# Patient Record
Sex: Female | Born: 1965 | Race: White | Hispanic: No | Marital: Married | Smoking: Never smoker
Health system: Southern US, Community
[De-identification: ages and names within clinical notes are randomized; demographics above are authoritative.]

## PROBLEM LIST (undated history)

## (undated) DIAGNOSIS — M069 Rheumatoid arthritis, unspecified: Secondary | ICD-10-CM

## (undated) DIAGNOSIS — IMO0002 Reserved for concepts with insufficient information to code with codable children: Secondary | ICD-10-CM

## (undated) DIAGNOSIS — M359 Systemic involvement of connective tissue, unspecified: Secondary | ICD-10-CM

## (undated) DIAGNOSIS — G43909 Migraine, unspecified, not intractable, without status migrainosus: Secondary | ICD-10-CM

## (undated) HISTORY — PX: BLADDER SUSPENSION: SHX72

## (undated) HISTORY — PX: ANTERIOR AND POSTERIOR VAGINAL REPAIR: SUR5

## (undated) HISTORY — PX: APPENDECTOMY: SHX54

## (undated) HISTORY — PX: TONSILLECTOMY: SUR1361

## (undated) HISTORY — PX: ABDOMINAL HYSTERECTOMY: SHX81

## (undated) HISTORY — PX: OTHER SURGICAL HISTORY: SHX169

---

## 2000-10-14 ENCOUNTER — Emergency Department (HOSPITAL_COMMUNITY): Admission: EM | Admit: 2000-10-14 | Discharge: 2000-10-14 | Payer: Self-pay | Admitting: Emergency Medicine

## 2005-11-24 ENCOUNTER — Emergency Department (HOSPITAL_COMMUNITY): Admission: AD | Admit: 2005-11-24 | Discharge: 2005-11-24 | Payer: Self-pay | Admitting: Emergency Medicine

## 2013-06-11 ENCOUNTER — Encounter (HOSPITAL_BASED_OUTPATIENT_CLINIC_OR_DEPARTMENT_OTHER): Payer: Self-pay | Admitting: Emergency Medicine

## 2013-06-11 ENCOUNTER — Emergency Department (HOSPITAL_BASED_OUTPATIENT_CLINIC_OR_DEPARTMENT_OTHER)
Admission: EM | Admit: 2013-06-11 | Discharge: 2013-06-11 | Disposition: A | Discharge status: Home / Self Care | Payer: Commercial Managed Care - PPO | Attending: Emergency Medicine | Admitting: Emergency Medicine

## 2013-06-11 DIAGNOSIS — R112 Nausea with vomiting, unspecified: Secondary | ICD-10-CM

## 2013-06-11 DIAGNOSIS — M359 Systemic involvement of connective tissue, unspecified: Secondary | ICD-10-CM | POA: Insufficient documentation

## 2013-06-11 DIAGNOSIS — R109 Unspecified abdominal pain: Secondary | ICD-10-CM | POA: Insufficient documentation

## 2013-06-11 DIAGNOSIS — G43909 Migraine, unspecified, not intractable, without status migrainosus: Secondary | ICD-10-CM | POA: Insufficient documentation

## 2013-06-11 DIAGNOSIS — IMO0002 Reserved for concepts with insufficient information to code with codable children: Secondary | ICD-10-CM | POA: Insufficient documentation

## 2013-06-11 DIAGNOSIS — Z79899 Other long term (current) drug therapy: Secondary | ICD-10-CM | POA: Insufficient documentation

## 2013-06-11 DIAGNOSIS — Z9889 Other specified postprocedural states: Secondary | ICD-10-CM | POA: Insufficient documentation

## 2013-06-11 HISTORY — DX: Reserved for concepts with insufficient information to code with codable children: IMO0002

## 2013-06-11 HISTORY — DX: Systemic involvement of connective tissue, unspecified: M35.9

## 2013-06-11 HISTORY — DX: Migraine, unspecified, not intractable, without status migrainosus: G43.909

## 2013-06-11 LAB — URINALYSIS, ROUTINE W REFLEX MICROSCOPIC
BILIRUBIN URINE: NEGATIVE
GLUCOSE, UA: NEGATIVE mg/dL
Hgb urine dipstick: NEGATIVE
KETONES UR: 15 mg/dL — AB
Leukocytes, UA: NEGATIVE
Nitrite: NEGATIVE
PROTEIN: NEGATIVE mg/dL
Specific Gravity, Urine: 1.024 (ref 1.005–1.030)
UROBILINOGEN UA: 0.2 mg/dL (ref 0.0–1.0)
pH: 7 (ref 5.0–8.0)

## 2013-06-11 LAB — COMPREHENSIVE METABOLIC PANEL
ALK PHOS: 91 U/L (ref 39–117)
ALT: 10 U/L (ref 0–35)
AST: 13 U/L (ref 0–37)
Albumin: 4.2 g/dL (ref 3.5–5.2)
BILIRUBIN TOTAL: 0.2 mg/dL — AB (ref 0.3–1.2)
BUN: 14 mg/dL (ref 6–23)
CALCIUM: 9.8 mg/dL (ref 8.4–10.5)
CHLORIDE: 104 meq/L (ref 96–112)
CO2: 25 meq/L (ref 19–32)
Creatinine, Ser: 1 mg/dL (ref 0.50–1.10)
GFR, EST AFRICAN AMERICAN: 75 mL/min — AB (ref >90)
GFR, EST NON AFRICAN AMERICAN: 65 mL/min — AB (ref >90)
GLUCOSE: 122 mg/dL — AB (ref 70–99)
Potassium: 4 mEq/L (ref 3.7–5.3)
SODIUM: 142 meq/L (ref 137–147)
Total Protein: 7 g/dL (ref 6.0–8.3)

## 2013-06-11 LAB — CBC WITH DIFFERENTIAL/PLATELET
Basophils Absolute: 0 10*3/uL (ref 0.0–0.1)
Basophils Relative: 0 % (ref 0–1)
EOS PCT: 0 % (ref 0–5)
Eosinophils Absolute: 0 10*3/uL (ref 0.0–0.7)
HCT: 42 % (ref 36.0–46.0)
Hemoglobin: 13.7 g/dL (ref 12.0–15.0)
LYMPHS ABS: 0.7 10*3/uL (ref 0.7–4.0)
LYMPHS PCT: 8 % — AB (ref 12–46)
MCH: 29.3 pg (ref 26.0–34.0)
MCHC: 32.6 g/dL (ref 30.0–36.0)
MCV: 89.9 fL (ref 78.0–100.0)
Monocytes Absolute: 0.4 10*3/uL (ref 0.1–1.0)
Monocytes Relative: 5 % (ref 3–12)
NEUTROS ABS: 7.8 10*3/uL — AB (ref 1.7–7.7)
Neutrophils Relative %: 88 % — ABNORMAL HIGH (ref 43–77)
PLATELETS: 198 10*3/uL (ref 150–400)
RBC: 4.67 MIL/uL (ref 3.87–5.11)
RDW: 12.3 % (ref 11.5–15.5)
WBC: 8.8 10*3/uL (ref 4.0–10.5)

## 2013-06-11 LAB — LIPASE, BLOOD: Lipase: 14 U/L (ref 11–59)

## 2013-06-11 MED ORDER — PROMETHAZINE HCL 25 MG/ML IJ SOLN
25.0000 mg | Freq: Once | INTRAMUSCULAR | Status: AC
  Administered 2013-06-11: 25 mg via INTRAVENOUS
  Filled 2013-06-11: qty 1

## 2013-06-11 MED ORDER — PROMETHAZINE HCL 25 MG PO TABS: 25.0000 mg | ORAL_TABLET | Freq: Four times a day (QID) | ORAL | Status: DC | PRN

## 2013-06-11 MED ORDER — ONDANSETRON HCL 4 MG/2ML IJ SOLN
4.0000 mg | Freq: Once | INTRAMUSCULAR | Status: AC
  Administered 2013-06-11: 4 mg via INTRAVENOUS
  Filled 2013-06-11: qty 2

## 2013-06-11 MED ORDER — SODIUM CHLORIDE 0.9 % IV BOLUS (SEPSIS)
1000.0000 mL | Freq: Once | INTRAVENOUS | Status: AC
  Administered 2013-06-11: 1000 mL via INTRAVENOUS

## 2013-06-11 MED ORDER — ONDANSETRON 8 MG PO TBDP: 8.0000 mg | ORAL_TABLET | Freq: Three times a day (TID) | ORAL | Status: DC | PRN

## 2013-06-11 NOTE — ED Notes (Signed)
Abdominal pain, vomiting and headache that started last night.  Vomited x 8.

## 2013-06-11 NOTE — ED Notes (Signed)
The patient stated that she is unable to urinate at this time. She know that we need a sample she stated that she will get one as soon as she is able.

## 2013-06-11 NOTE — ED Provider Notes (Addendum)
CSN: [id number]     Arrival date & time 06/11/13  1243 History   First MD Initiated Contact with Patient 06/11/13 1414     Chief Complaint  Patient presents with  . Emesis  . Abdominal Pain     (Consider location/radiation/quality/duration/timing/severity/associated sxs/prior Treatment) HPI 48 year old female who presents today complaining of nausea and vomiting. She states she began vomiting last night and that it was initially food but then became just a yellowish substance. She has had approximately 8 episodes of vomiting. She has had a few loose stools with this. She has not had any fever or chills. She states she had one similar episode in the past which did require hospitalization due to intractable vomiting 4. She took Zofran at home without relief. Past Medical History  Diagnosis Date  . DDD (degenerative disc disease)   . Migraine   . Connective tissue disease    Past Surgical History  Procedure Laterality Date  . Appendectomy    . Tonsillectomy    . Uterine sling    . Abdominal hysterectomy    . Bladder suspension    . Vaginalplasty    . Endocele repair    . Anterior and posterior vaginal repair     No family history on file. History  Substance Use Topics  . Smoking status: Never Smoker   . Smokeless tobacco: Not on file  . Alcohol Use: No   OB History   Grav Para Term Preterm Abortions TAB SAB Ect Mult Living                 Review of Systems  Constitutional: Negative.  Negative for fever.  HENT: Negative.   Respiratory: Negative.   Cardiovascular: Negative.   Gastrointestinal: Positive for nausea and vomiting. Negative for abdominal pain, constipation, blood in stool and abdominal distention.  Endocrine: Negative.   Genitourinary: Negative.   Neurological: Negative.   Hematological: Negative.   Psychiatric/Behavioral: Negative.   All other systems reviewed and are negative.      Allergies  Biaxin; Decadron; Depacon; Depakote; Dhea; Reglan;  Toradol; and Ultram  Home Medications   Current Outpatient Rx  Name  Route  Sig  Dispense  Refill  . BuPROPion HCl (WELLBUTRIN PO)   Oral   Take by mouth.         . ClonazePAM (KLONOPIN PO)   Oral   Take by mouth.         . Esomeprazole Magnesium (NEXIUM PO)   Oral   Take by mouth.         . fentaNYL (DURAGESIC - DOSED MCG/HR) 50 MCG/HR   Transdermal   Place 50 mcg onto the skin every 3 (three) days.         Marland Kitchen. HYDROcodone-acetaminophen (NORCO/VICODIN) 5-325 MG per tablet   Oral   Take 1 tablet by mouth every 6 (six) hours as needed for moderate pain.         . hydroxychloroquine (PLAQUENIL) 200 MG tablet   Oral   Take by mouth daily.         . Ondansetron HCl (ZOFRAN PO)   Oral   Take by mouth.         . prednisoLONE 5 MG TABS tablet   Oral   Take by mouth.         . Prochlorperazine Maleate (COMPAZINE PO)   Oral   Take by mouth.         Marland Kitchen. TIZANIDINE HCL PO   Oral  Take by mouth.         . topiramate (TOPAMAX) 100 MG tablet   Oral   Take 100 mg by mouth 2 (two) times daily.         Marland Kitchen ZOLMitriptan (ZOMIG PO)   Oral   Take by mouth.         . Zolpidem Tartrate (AMBIEN PO)   Oral   Take by mouth.          BP 102/59  Pulse 90  Temp(Src) 98.5 F (36.9 C) (Oral)  Resp 24 Physical Exam  Nursing note and vitals reviewed. Constitutional: She is oriented to person, place, and time. She appears well-developed and well-nourished.  HENT:  Head: Normocephalic and atraumatic.  Right Ear: External ear normal.  Left Ear: External ear normal.  Nose: Nose normal.  Mouth/Throat: Oropharynx is clear and moist.  Eyes: Conjunctivae and EOM are normal. Pupils are equal, round, and reactive to light.  Neck: Normal range of motion. Neck supple.  Cardiovascular: Normal rate, regular rhythm, normal heart sounds and intact distal pulses.   Pulmonary/Chest: Effort normal and breath sounds normal.  Abdominal: Soft. Bowel sounds are normal.   Musculoskeletal: Normal range of motion.  Neurological: She is alert and oriented to person, place, and time. She has normal reflexes.  Skin: Skin is warm and dry.  Psychiatric: She has a normal mood and affect. Her behavior is normal. Thought content normal.    ED Course  Procedures (including critical care time) Labs Review Labs Reviewed  CBC WITH DIFFERENTIAL - Abnormal; Notable for the following:    Neutrophils Relative % 88 (*)    Neutro Abs 7.8 (*)    Lymphocytes Relative 8 (*)    All other components within normal limits  COMPREHENSIVE METABOLIC PANEL - Abnormal; Notable for the following:    Glucose, Bld 122 (*)    Total Bilirubin 0.2 (*)    GFR calc non Af Amer 65 (*)    GFR calc Af Amer 75 (*)    All other components within normal limits  LIPASE, BLOOD  URINALYSIS, ROUTINE W REFLEX MICROSCOPIC   Imaging Review No results found.   EKG Interpretation None      MDM   Final diagnoses:  None    Patient received IV hydration here and is able to tolerate clear liquids although she continues to be nauseated. Labs look generally normal with the exception of glucose at 122 and 88% neutrophils. Her plan is to give patient prescriptions for anti-emetics. She is counseled regarding oral rehydration. She is advised to return if she is worse or not able to tolerate fluids over 24-48 hours.   Hilario Quarry, MD 06/11/13 1549  Hilario Quarry, MD 07/07/13 [phone]

## 2013-06-11 NOTE — Discharge Instructions (Signed)

## 2013-08-16 ENCOUNTER — Emergency Department (HOSPITAL_BASED_OUTPATIENT_CLINIC_OR_DEPARTMENT_OTHER): Payer: Commercial Managed Care - PPO

## 2013-08-16 ENCOUNTER — Emergency Department (HOSPITAL_BASED_OUTPATIENT_CLINIC_OR_DEPARTMENT_OTHER)
Admission: EM | Admit: 2013-08-16 | Discharge: 2013-08-17 | Disposition: A | Discharge status: Home / Self Care | Payer: Commercial Managed Care - PPO | Attending: Emergency Medicine | Admitting: Emergency Medicine

## 2013-08-16 ENCOUNTER — Encounter (HOSPITAL_BASED_OUTPATIENT_CLINIC_OR_DEPARTMENT_OTHER): Payer: Self-pay | Admitting: Emergency Medicine

## 2013-08-16 DIAGNOSIS — N390 Urinary tract infection, site not specified: Secondary | ICD-10-CM | POA: Insufficient documentation

## 2013-08-16 DIAGNOSIS — R109 Unspecified abdominal pain: Secondary | ICD-10-CM | POA: Diagnosis not present

## 2013-08-16 DIAGNOSIS — R110 Nausea: Secondary | ICD-10-CM | POA: Insufficient documentation

## 2013-08-16 DIAGNOSIS — G43909 Migraine, unspecified, not intractable, without status migrainosus: Secondary | ICD-10-CM | POA: Diagnosis not present

## 2013-08-16 DIAGNOSIS — R300 Dysuria: Secondary | ICD-10-CM | POA: Diagnosis present

## 2013-08-16 DIAGNOSIS — Z79899 Other long term (current) drug therapy: Secondary | ICD-10-CM | POA: Diagnosis not present

## 2013-08-16 DIAGNOSIS — Z87442 Personal history of urinary calculi: Secondary | ICD-10-CM | POA: Diagnosis not present

## 2013-08-16 DIAGNOSIS — IMO0002 Reserved for concepts with insufficient information to code with codable children: Secondary | ICD-10-CM | POA: Insufficient documentation

## 2013-08-16 LAB — CBC WITH DIFFERENTIAL/PLATELET
Basophils Absolute: 0 10*3/uL (ref 0.0–0.1)
Basophils Relative: 0 % (ref 0–1)
EOS ABS: 0.1 10*3/uL (ref 0.0–0.7)
EOS PCT: 2 % (ref 0–5)
HCT: 39.6 % (ref 36.0–46.0)
Hemoglobin: 12.8 g/dL (ref 12.0–15.0)
LYMPHS PCT: 21 % (ref 12–46)
Lymphs Abs: 1.3 10*3/uL (ref 0.7–4.0)
MCH: 29.4 pg (ref 26.0–34.0)
MCHC: 32.3 g/dL (ref 30.0–36.0)
MCV: 91 fL (ref 78.0–100.0)
Monocytes Absolute: 0.4 10*3/uL (ref 0.1–1.0)
Monocytes Relative: 7 % (ref 3–12)
Neutro Abs: 4.1 10*3/uL (ref 1.7–7.7)
Neutrophils Relative %: 70 % (ref 43–77)
PLATELETS: 149 10*3/uL — AB (ref 150–400)
RBC: 4.35 MIL/uL (ref 3.87–5.11)
RDW: 12.8 % (ref 11.5–15.5)
WBC: 5.9 10*3/uL (ref 4.0–10.5)

## 2013-08-16 LAB — URINALYSIS, ROUTINE W REFLEX MICROSCOPIC
Glucose, UA: NEGATIVE mg/dL
KETONES UR: 15 mg/dL — AB
Nitrite: POSITIVE — AB
PROTEIN: 100 mg/dL — AB
Specific Gravity, Urine: 1.029 (ref 1.005–1.030)
Urobilinogen, UA: >8 mg/dL — ABNORMAL HIGH (ref 0.0–1.0)
pH: 5 (ref 5.0–8.0)

## 2013-08-16 LAB — URINE MICROSCOPIC-ADD ON

## 2013-08-16 LAB — BASIC METABOLIC PANEL
BUN: 14 mg/dL (ref 6–23)
CALCIUM: 9.4 mg/dL (ref 8.4–10.5)
CO2: 25 mEq/L (ref 19–32)
Chloride: 108 mEq/L (ref 96–112)
Creatinine, Ser: 1.1 mg/dL (ref 0.50–1.10)
GFR calc Af Amer: 67 mL/min — ABNORMAL LOW (ref >90)
GFR, EST NON AFRICAN AMERICAN: 58 mL/min — AB (ref >90)
Glucose, Bld: 74 mg/dL (ref 70–99)
Potassium: 3.6 mEq/L — ABNORMAL LOW (ref 3.7–5.3)
SODIUM: 143 meq/L (ref 137–147)

## 2013-08-16 MED ORDER — SULFAMETHOXAZOLE-TRIMETHOPRIM 800-160 MG PO TABS: 1.0000 | ORAL_TABLET | Freq: Two times a day (BID) | ORAL | Status: AC

## 2013-08-16 MED ORDER — PROMETHAZINE HCL 25 MG PO TABS: 25.0000 mg | ORAL_TABLET | Freq: Four times a day (QID) | ORAL | Status: DC | PRN

## 2013-08-16 MED ORDER — OXYCODONE-ACETAMINOPHEN 5-325 MG PO TABS: 1.0000 | ORAL_TABLET | ORAL | Status: AC | PRN

## 2013-08-16 MED ORDER — PHENAZOPYRIDINE HCL 95 MG PO TABS: 95.0000 mg | ORAL_TABLET | Freq: Three times a day (TID) | ORAL | Status: AC | PRN

## 2013-08-16 MED ORDER — MORPHINE SULFATE 4 MG/ML IJ SOLN
4.0000 mg | Freq: Once | INTRAMUSCULAR | Status: AC
  Administered 2013-08-16: 4 mg via INTRAVENOUS
  Filled 2013-08-16: qty 1

## 2013-08-16 MED ORDER — HYDROMORPHONE HCL PF 1 MG/ML IJ SOLN
1.0000 mg | Freq: Once | INTRAMUSCULAR | Status: AC
Start: 2013-08-16 — End: 2013-08-16
  Administered 2013-08-16: 1 mg via INTRAVENOUS
  Filled 2013-08-16: qty 1

## 2013-08-16 MED ORDER — IOHEXOL 300 MG/ML  SOLN
100.0000 mL | Freq: Once | INTRAMUSCULAR | Status: AC | PRN
  Administered 2013-08-16: 100 mL via INTRAVENOUS

## 2013-08-16 MED ORDER — PROMETHAZINE HCL 25 MG/ML IJ SOLN
25.0000 mg | Freq: Once | INTRAMUSCULAR | Status: AC
  Administered 2013-08-16: 25 mg via INTRAVENOUS
  Filled 2013-08-16: qty 1

## 2013-08-16 MED ORDER — CEFTRIAXONE SODIUM 1 G IJ SOLR
INTRAMUSCULAR | Status: AC
  Administered 2013-08-16: 19:00:00
  Filled 2013-08-16: qty 10

## 2013-08-16 MED ORDER — ONDANSETRON HCL 4 MG/2ML IJ SOLN
4.0000 mg | Freq: Once | INTRAMUSCULAR | Status: AC
  Administered 2013-08-16: 4 mg via INTRAVENOUS
  Filled 2013-08-16: qty 2

## 2013-08-16 MED ORDER — DEXTROSE 5 % IV SOLN
1.0000 g | Freq: Once | INTRAVENOUS | Status: AC
  Administered 2013-08-16: 1 g via INTRAVENOUS

## 2013-08-16 MED ORDER — SODIUM CHLORIDE 0.9 % IV BOLUS (SEPSIS)
1000.0000 mL | Freq: Once | INTRAVENOUS | Status: AC
  Administered 2013-08-16: 1000 mL via INTRAVENOUS

## 2013-08-16 NOTE — Discharge Instructions (Signed)
Urinary Tract Infection  Urinary tract infections (UTIs) can develop anywhere along your urinary tract. Your urinary tract is your body's drainage system for removing wastes and extra water. Your urinary tract includes two kidneys, two ureters, a bladder, and a urethra. Your kidneys are a pair of bean-shaped organs. Each kidney is about the size of your fist. They are located below your ribs, one on each side of your spine.  CAUSES  Infections are caused by microbes, which are microscopic organisms, including fungi, viruses, and bacteria. These organisms are so small that they can only be seen through a microscope. Bacteria are the microbes that most commonly cause UTIs.  SYMPTOMS   Symptoms of UTIs may vary by age and gender of the patient and by the location of the infection. Symptoms in young women typically include a frequent and intense urge to urinate and a painful, burning feeling in the bladder or urethra during urination. Older women and men are more likely to be tired, shaky, and weak and have muscle aches and abdominal pain. A fever may mean the infection is in your kidneys. Other symptoms of a kidney infection include pain in your back or sides below the ribs, nausea, and vomiting.  DIAGNOSIS  To diagnose a UTI, your caregiver will ask you about your symptoms. Your caregiver also will ask to provide a urine sample. The urine sample will be tested for bacteria and white blood cells. White blood cells are made by your body to help fight infection.  TREATMENT   Typically, UTIs can be treated with medication. Because most UTIs are caused by a bacterial infection, they usually can be treated with the use of antibiotics. The choice of antibiotic and length of treatment depend on your symptoms and the type of bacteria causing your infection.  HOME CARE INSTRUCTIONS   If you were prescribed antibiotics, take them exactly as your caregiver instructs you. Finish the medication even if you feel better after you  have only taken some of the medication.   Drink enough water and fluids to keep your urine clear or pale yellow.   Avoid caffeine, tea, and carbonated beverages. They tend to irritate your bladder.   Empty your bladder often. Avoid holding urine for long periods of time.   Empty your bladder before and after sexual intercourse.   After a bowel movement, women should cleanse from front to back. Use each tissue only once.  SEEK MEDICAL CARE IF:    You have back pain.   You develop a fever.   Your symptoms do not begin to resolve within 3 days.  SEEK IMMEDIATE MEDICAL CARE IF:    You have severe back pain or lower abdominal pain.   You develop chills.   You have nausea or vomiting.   You have continued burning or discomfort with urination.  MAKE SURE YOU:    Understand these instructions.   Will watch your condition.   Will get help right away if you are not doing well or get worse.  Document Released: 12/05/2004 Document Revised: 08/27/2011 Document Reviewed: 04/05/2011  [employer] Patient Information 2014 [employer].

## 2013-08-16 NOTE — ED Notes (Signed)
UTI symptoms since yesterday.

## 2013-08-16 NOTE — ED Provider Notes (Signed)
This chart was scribed for April Number, DO by Charline Bills, ED Scribe. The patient was seen in room MH01/MH01. Patient's care was started at 6:48 PM.  TIME SEEN: 6:48 PM  CHIEF COMPLAINT: dysuria  HPI:  April Rhodes is a 48 y.o. female who presents to the Emergency Department complaining of gradually worsening dysuria onset yesterday. Pt reports associated R sided back pain, abdominal pain, nausea. She denies fever, chills, vomiting, diarrhea. Pt has taken Tylenol and Vicodin with no relief. Pt has a h/o multiple UTIs and h/o kidney stones years ago. Patient has had a abdominal hysterectomy and is now status post bladder suspension, uterine sling with mesh, vaginoplasty. Pt has an appointment with her urologist tomorrow. She states her symptoms feel like her prior UTIs.  ROS: See HPI Constitutional: no fever, no chills Eyes: no drainage  ENT: no runny nose   Cardiovascular:  no chest pain  Resp: no SOB  GI: nausea, abdominal pain, no vomiting, no diarrhea GU: dysuria, flank pain Integumentary: no rash  Allergy: no hives  Musculoskeletal: no leg swelling  Neurological: no slurred speech ROS otherwise negative  PAST MEDICAL HISTORY/PAST SURGICAL HISTORY:  Past Medical History  Diagnosis Date  . DDD (degenerative disc disease)   . Migraine   . Connective tissue disease     MEDICATIONS:  Prior to Admission medications   Medication Sig Start Date End Date Taking? Authorizing Provider  BuPROPion HCl (WELLBUTRIN PO) Take by mouth.    Historical Provider, MD  ClonazePAM (KLONOPIN PO) Take by mouth.    Historical Provider, MD  Esomeprazole Magnesium (NEXIUM PO) Take by mouth.    Historical Provider, MD  fentaNYL (DURAGESIC - DOSED MCG/HR) 50 MCG/HR Place 50 mcg onto the skin every 3 (three) days.    Historical Provider, MD  HYDROcodone-acetaminophen (NORCO/VICODIN) 5-325 MG per tablet Take 1 tablet by mouth every 6 (six) hours as needed for moderate pain.    Historical Provider, MD   hydroxychloroquine (PLAQUENIL) 200 MG tablet Take by mouth daily.    Historical Provider, MD  ondansetron (ZOFRAN ODT) 8 MG disintegrating tablet Take 1 tablet (8 mg total) by mouth every 8 (eight) hours as needed for nausea or vomiting. 06/11/13   Hilario Quarry, MD  Ondansetron HCl (ZOFRAN PO) Take by mouth.    Historical Provider, MD  prednisoLONE 5 MG TABS tablet Take by mouth.    Historical Provider, MD  Prochlorperazine Maleate (COMPAZINE PO) Take by mouth.    Historical Provider, MD  promethazine (PHENERGAN) 25 MG tablet Take 1 tablet (25 mg total) by mouth every 6 (six) hours as needed for nausea or vomiting. 06/11/13   Hilario Quarry, MD  TIZANIDINE HCL PO Take by mouth.    Historical Provider, MD  topiramate (TOPAMAX) 100 MG tablet Take 100 mg by mouth 2 (two) times daily.    Historical Provider, MD  ZOLMitriptan (ZOMIG PO) Take by mouth.    Historical Provider, MD  Zolpidem Tartrate (AMBIEN PO) Take by mouth.    Historical Provider, MD    ALLERGIES:  Allergies  Allergen Reactions  . Biaxin [Clarithromycin]   . Decadron [Dexamethasone]   . Depacon [Valproate Sodium]   . Depakote [Divalproex Sodium]   . Dhea [Nutritional Supplements]   . Reglan [Metoclopramide]   . Toradol [Ketorolac Tromethamine]   . Ultram [Tramadol]     SOCIAL HISTORY:  History  Substance Use Topics  . Smoking status: Never Smoker   . Smokeless tobacco: Not on file  .  Alcohol Use: No    FAMILY HISTORY: No family history on file.  EXAM: Triage Vitals: BP 150/93  Pulse 67  Temp(Src) 98.2 F (36.8 C) (Oral)  Resp 20  Ht 5\' 8"  (1.727 m)  Wt 155 lb (70.308 kg)  BMI 23.57 kg/m2  SpO2 100% CONSTITUTIONAL: Alert and oriented and responds appropriately to questions. Well-appearing; well-nourished, appears uncomfortable but is nontoxic HEAD: Normocephalic EYES: Conjunctivae clear, PERRL ENT: normal nose; no rhinorrhea; moist mucous membranes; pharynx without lesions noted NECK: Supple, no  meningismus, no LAD  CARD: RRR; S1 and S2 appreciated; no murmurs, no clicks, no rubs, no gallops RESP: Normal chest excursion without splinting or tachypnea; breath sounds clear and equal bilaterally; no wheezes, no rhonchi, no rales,  ABD/GI: Normal bowel sounds; non-distended; soft, no rebound, no guarding, mild suprapubic tenderness; no peritoneal signs BACK:  The back appears normal and is non-tender to palpation; patient has right CVA tenderness EXT: Normal ROM in all joints; non-tender to palpation; no edema; normal capillary refill; no cyanosis    SKIN: Normal color for age and race; warm NEURO: Moves all extremities equally PSYCH: The patient's mood and manner are appropriate. Grooming and personal hygiene are appropriate.  DIAGNOSTIC STUDIES: Oxygen Saturation is 100% on RA, normal by my interpretation.    COORDINATION OF CARE: 6:50 PM Discussed treatment plan with pt at bedside and pt agreed to plan.  MEDICAL DECISION MAKING: Patient here with urinary tract infection. Culture is pending. She states normally "sulfa drugs" have helped with her UTIs. Given she does have some right-sided back pain and flank tenderness with a history of kidney stones, will obtain CT of her abdomen and pelvis to rule out obstructing stone given she does appear very uncomfortable on exam. Will also obtain basic labs, give IV fluids, Zofran, morphine for symptom control.  ED PROGRESS: Patient's are unremarkable. Her CT scan shows no kidney stone or signs of pyelonephritis. Her pain is been controlled with Dilaudid and Phenergan. She received IV ceftriaxone the emergency department. She has an [facility]. She states that normally sulfa medications help with her UTIs but we have no prior urine culture here. Will discharge home with Bactrim, prescription for Percocet and Phenergan. Have discussed supportive care instructions and return precautions. Patient and her husband  bedside verbalize understanding and are comfortable with plan.  I personally performed the services described in this documentation, which was scribed in my presence. The recorded information has been reviewed and is accurate.    Layla MawKristen N Marcelis Wissner, DO 08/16/13 2200

## 2013-08-19 DIAGNOSIS — R102 Pelvic and perineal pain: Secondary | ICD-10-CM | POA: Insufficient documentation

## 2013-08-19 DIAGNOSIS — H814 Vertigo of central origin: Secondary | ICD-10-CM | POA: Insufficient documentation

## 2013-08-19 DIAGNOSIS — K869 Disease of pancreas, unspecified: Secondary | ICD-10-CM | POA: Insufficient documentation

## 2013-08-19 DIAGNOSIS — R269 Unspecified abnormalities of gait and mobility: Secondary | ICD-10-CM | POA: Insufficient documentation

## 2013-08-19 DIAGNOSIS — M79609 Pain in unspecified limb: Secondary | ICD-10-CM | POA: Insufficient documentation

## 2013-08-19 DIAGNOSIS — R112 Nausea with vomiting, unspecified: Secondary | ICD-10-CM | POA: Insufficient documentation

## 2013-08-19 DIAGNOSIS — R3989 Other symptoms and signs involving the genitourinary system: Secondary | ICD-10-CM | POA: Insufficient documentation

## 2013-08-19 DIAGNOSIS — E860 Dehydration: Secondary | ICD-10-CM | POA: Insufficient documentation

## 2013-08-19 DIAGNOSIS — F980 Enuresis not due to a substance or known physiological condition: Secondary | ICD-10-CM | POA: Insufficient documentation

## 2013-08-19 DIAGNOSIS — N390 Urinary tract infection, site not specified: Secondary | ICD-10-CM | POA: Insufficient documentation

## 2013-08-19 DIAGNOSIS — J019 Acute sinusitis, unspecified: Secondary | ICD-10-CM | POA: Insufficient documentation

## 2013-08-19 DIAGNOSIS — M5430 Sciatica, unspecified side: Secondary | ICD-10-CM | POA: Insufficient documentation

## 2013-08-19 DIAGNOSIS — K253 Acute gastric ulcer without hemorrhage or perforation: Secondary | ICD-10-CM | POA: Insufficient documentation

## 2013-08-19 DIAGNOSIS — R5383 Other fatigue: Secondary | ICD-10-CM | POA: Insufficient documentation

## 2013-08-19 DIAGNOSIS — R197 Diarrhea, unspecified: Secondary | ICD-10-CM | POA: Insufficient documentation

## 2013-08-19 DIAGNOSIS — F1910 Other psychoactive substance abuse, uncomplicated: Secondary | ICD-10-CM | POA: Insufficient documentation

## 2013-08-19 DIAGNOSIS — R5381 Other malaise: Secondary | ICD-10-CM | POA: Insufficient documentation

## 2013-08-19 DIAGNOSIS — M25579 Pain in unspecified ankle and joints of unspecified foot: Secondary | ICD-10-CM | POA: Insufficient documentation

## 2013-08-19 DIAGNOSIS — I8000 Phlebitis and thrombophlebitis of superficial vessels of unspecified lower extremity: Secondary | ICD-10-CM | POA: Insufficient documentation

## 2013-08-19 DIAGNOSIS — R209 Unspecified disturbances of skin sensation: Secondary | ICD-10-CM | POA: Insufficient documentation

## 2013-11-05 ENCOUNTER — Emergency Department (HOSPITAL_BASED_OUTPATIENT_CLINIC_OR_DEPARTMENT_OTHER)
Admission: EM | Admit: 2013-11-05 | Discharge: 2013-11-06 | Disposition: A | Discharge status: Home / Self Care | Payer: Commercial Managed Care - PPO | Attending: Emergency Medicine | Admitting: Emergency Medicine

## 2013-11-05 ENCOUNTER — Encounter (HOSPITAL_BASED_OUTPATIENT_CLINIC_OR_DEPARTMENT_OTHER): Payer: Self-pay | Admitting: Emergency Medicine

## 2013-11-05 DIAGNOSIS — G43909 Migraine, unspecified, not intractable, without status migrainosus: Secondary | ICD-10-CM | POA: Diagnosis not present

## 2013-11-05 DIAGNOSIS — T50901A Poisoning by unspecified drugs, medicaments and biological substances, accidental (unintentional), initial encounter: Secondary | ICD-10-CM

## 2013-11-05 DIAGNOSIS — Y929 Unspecified place or not applicable: Secondary | ICD-10-CM | POA: Diagnosis not present

## 2013-11-05 DIAGNOSIS — T40601A Poisoning by unspecified narcotics, accidental (unintentional), initial encounter: Secondary | ICD-10-CM | POA: Insufficient documentation

## 2013-11-05 DIAGNOSIS — IMO0002 Reserved for concepts with insufficient information to code with codable children: Secondary | ICD-10-CM | POA: Diagnosis not present

## 2013-11-05 DIAGNOSIS — Y9389 Activity, other specified: Secondary | ICD-10-CM | POA: Insufficient documentation

## 2013-11-05 DIAGNOSIS — Z79899 Other long term (current) drug therapy: Secondary | ICD-10-CM | POA: Insufficient documentation

## 2013-11-05 DIAGNOSIS — Z8739 Personal history of other diseases of the musculoskeletal system and connective tissue: Secondary | ICD-10-CM | POA: Diagnosis not present

## 2013-11-05 DIAGNOSIS — T400X1A Poisoning by opium, accidental (unintentional), initial encounter: Secondary | ICD-10-CM | POA: Insufficient documentation

## 2013-11-05 NOTE — ED Notes (Signed)
Pt states took her daughters meds on accident. 35 oxycodone  ago

## 2013-11-06 DIAGNOSIS — T40601A Poisoning by unspecified narcotics, accidental (unintentional), initial encounter: Secondary | ICD-10-CM | POA: Diagnosis not present

## 2013-11-06 LAB — RAPID URINE DRUG SCREEN, HOSP PERFORMED
Amphetamines: NOT DETECTED
BENZODIAZEPINES: NOT DETECTED
Barbiturates: NOT DETECTED
Cocaine: NOT DETECTED
OPIATES: NOT DETECTED
Tetrahydrocannabinol: NOT DETECTED

## 2013-11-06 LAB — COMPREHENSIVE METABOLIC PANEL
ALT: 18 U/L (ref 0–35)
AST: 21 U/L (ref 0–37)
Albumin: 4.1 g/dL (ref 3.5–5.2)
Alkaline Phosphatase: 96 U/L (ref 39–117)
Anion gap: 11 (ref 5–15)
BUN: 15 mg/dL (ref 6–23)
CALCIUM: 9.9 mg/dL (ref 8.4–10.5)
CO2: 26 mEq/L (ref 19–32)
Chloride: 107 mEq/L (ref 96–112)
Creatinine, Ser: 1.1 mg/dL (ref 0.50–1.10)
GFR calc Af Amer: 67 mL/min — ABNORMAL LOW (ref >90)
GFR, EST NON AFRICAN AMERICAN: 58 mL/min — AB (ref >90)
Glucose, Bld: 97 mg/dL (ref 70–99)
Potassium: 4 mEq/L (ref 3.7–5.3)
SODIUM: 144 meq/L (ref 137–147)
TOTAL PROTEIN: 7.2 g/dL (ref 6.0–8.3)
Total Bilirubin: <0.2 mg/dL — ABNORMAL LOW (ref 0.3–1.2)

## 2013-11-06 LAB — CBC WITH DIFFERENTIAL/PLATELET
BASOS ABS: 0 10*3/uL (ref 0.0–0.1)
BASOS PCT: 0 % (ref 0–1)
EOS ABS: 0.1 10*3/uL (ref 0.0–0.7)
EOS PCT: 2 % (ref 0–5)
HEMATOCRIT: 41.1 % (ref 36.0–46.0)
Hemoglobin: 13.2 g/dL (ref 12.0–15.0)
Lymphocytes Relative: 40 % (ref 12–46)
Lymphs Abs: 1.9 10*3/uL (ref 0.7–4.0)
MCH: 29.2 pg (ref 26.0–34.0)
MCHC: 32.1 g/dL (ref 30.0–36.0)
MCV: 90.9 fL (ref 78.0–100.0)
MONO ABS: 0.4 10*3/uL (ref 0.1–1.0)
Monocytes Relative: 8 % (ref 3–12)
Neutro Abs: 2.3 10*3/uL (ref 1.7–7.7)
Neutrophils Relative %: 50 % (ref 43–77)
PLATELETS: 173 10*3/uL (ref 150–400)
RBC: 4.52 MIL/uL (ref 3.87–5.11)
RDW: 12.7 % (ref 11.5–15.5)
WBC: 4.6 10*3/uL (ref 4.0–10.5)

## 2013-11-06 LAB — URINALYSIS, ROUTINE W REFLEX MICROSCOPIC
Glucose, UA: NEGATIVE mg/dL
Hgb urine dipstick: NEGATIVE
KETONES UR: NEGATIVE mg/dL
Nitrite: NEGATIVE
PH: 5 (ref 5.0–8.0)
Protein, ur: NEGATIVE mg/dL
Specific Gravity, Urine: 1.022 (ref 1.005–1.030)
Urobilinogen, UA: 0.2 mg/dL (ref 0.0–1.0)

## 2013-11-06 LAB — URINE MICROSCOPIC-ADD ON

## 2013-11-06 LAB — SALICYLATE LEVEL: Salicylate Lvl: <2 mg/dL — ABNORMAL LOW (ref 2.8–20.0)

## 2013-11-06 LAB — ETHANOL: Alcohol, Ethyl (B): <11 mg/dL (ref 0–11)

## 2013-11-06 LAB — ACETAMINOPHEN LEVEL

## 2013-11-06 MED ORDER — ONDANSETRON HCL 4 MG/2ML IJ SOLN
4.0000 mg | Freq: Once | INTRAMUSCULAR | Status: AC
  Administered 2013-11-06: 4 mg via INTRAVENOUS

## 2013-11-06 MED ORDER — ACETAMINOPHEN 500 MG PO TABS
1000.0000 mg | ORAL_TABLET | Freq: Once | ORAL | Status: AC
  Administered 2013-11-06: 1000 mg via ORAL
  Filled 2013-11-06: qty 2

## 2013-11-06 MED ORDER — ONDANSETRON HCL 4 MG/2ML IJ SOLN
INTRAMUSCULAR | Status: AC
  Filled 2013-11-06: qty 2

## 2013-11-06 MED ORDER — ONDANSETRON HCL 4 MG/2ML IJ SOLN
4.0000 mg | Freq: Once | INTRAMUSCULAR | Status: AC
  Administered 2013-11-06: 4 mg via INTRAVENOUS
  Filled 2013-11-06: qty 2

## 2013-11-06 MED ORDER — SODIUM CHLORIDE 0.9 % IV BOLUS (SEPSIS)
1000.0000 mL | Freq: Once | INTRAVENOUS | Status: AC
  Administered 2013-11-06: 1000 mL via INTRAVENOUS

## 2013-11-06 MED ORDER — ACTIDOSE WITH SORBITOL 50 GM/240ML PO LIQD
ORAL | Status: AC
  Administered 2013-11-06: 50 g
  Filled 2013-11-06: qty 240

## 2013-11-06 NOTE — Discharge Instructions (Signed)
Accidental Overdose  A drug overdose occurs when a chemical substance (drug or medication) is used in amounts large enough to overcome a person. This may result in severe illness or death. This is a type of poisoning. Accidental overdoses of medications or other substances come from a variety of reasons. When this happens accidentally, it is often because the person taking the substance does not know enough about what they have taken. Drugs which commonly cause overdose deaths are alcohol, psychotropic medications (medications which affect the mind), pain medications, illegal drugs (street drugs) such as cocaine and heroin, and multiple drugs taken at the same time. It may result from careless behavior (such as over-indulging at a party). Other causes of overdose may include multiple drug use, a lapse in memory, or drug use after a period of no drug use.   Sometimes overdosing occurs because a person cannot remember if they have taken their medication.   A common unintentional overdose in young children involves multi-vitamins containing iron. Iron is a part of the hemoglobin molecule in blood. It is used to transport oxygen to living cells. When taken in small amounts, iron allows the body to restock hemoglobin. In large amounts, it causes problems in the body. If this overdose is not treated, it can lead to death.  Never take medicines that show signs of tampering or do not seem quite right. Never take medicines in the dark or in poor lighting. Read the label and check each dose of medicine before you take it. When adults are poisoned, it happens most often through carelessness or lack of information. Taking medicines in the dark or taking medicine prescribed for someone else to treat the same type of problem is a dangerous practice.  SYMPTOMS   Symptoms of overdose depend on the medication and amount taken. They can vary from over-activity with stimulant over-dosage, to sleepiness from depressants such as  alcohol, narcotics and tranquilizers. Confusion, dizziness, nausea and vomiting may be present. If problems are severe enough coma and death may result.  DIAGNOSIS   Diagnosis and management are generally straightforward if the drug is known. Otherwise it is more difficult. At times, certain symptoms and signs exhibited by the patient, or blood tests, can reveal the drug in question.   TREATMENT   In an emergency department, most patients can be treated with supportive measures. Antidotes may be available if there has been an overdose of opioids or benzodiazepines. A rapid improvement will often occur if this is the cause of overdose.  At home or away from medical care:   There may be no immediate problems or warning signs in children.   Not everything works well in all cases of poisoning.   Take immediate action. Poisons may act quickly.   If you think someone has swallowed medicine or a household product, and the person is unconscious, having seizures (convulsions), or is not breathing, immediately call for an ambulance.  IF a person is conscious and appears to be doing OK but has swallowed a poison:   Do not wait to see what effect the poison will have. Immediately call a poison control center (listed in the white pages of your telephone book under "Poison Control" or inside the front cover with other emergency numbers). Some poison control centers have TTY capability for the deaf. Check with your local center if you or someone in your family requires this service.   Keep the container so you can read the label on the product for ingredients.     Describe what, when, and how much was taken and the age and condition of the person poisoned. Inform them if the person is vomiting, choking, drowsy, shows a change in color or temperature of skin, is conscious or unconscious, or is convulsing.   Do not cause vomiting unless instructed by medical personnel. Do not induce vomiting or force liquids into a person who  is convulsing, unconscious, or very drowsy.  Stay calm and in control.    Activated charcoal also is sometimes used in certain types of poisoning and you may wish to add a supply to your emergency medicines. It is available without a prescription. Call a poison control center before using this medication.  PREVENTION   Thousands of children die every year from unintentional poisoning. This may be from household chemicals, poisoning from carbon monoxide in a car, taking their parent's medications, or simply taking a few iron pills or vitamins with iron. Poisoning comes from unexpected sources.   Store medicines out of the sight and reach of children, preferably in a locked cabinet. Do not keep medications in a food cabinet. Always store your medicines in a secure place. Get rid of expired medications.   If you have children living with you or have them as occasional guests, you should have child-resistant caps on your medicine containers. Keep everything out of reach. Child proof your home.   If you are called to the telephone or to answer the door while you are taking a medicine, take the container with you or put the medicine out of the reach of small children.   Do not take your medication in front of children. Do not tell your child how good a medication is and how good it is for them. They may get the idea it is more of a treat.   If you are an adult and have accidentally taken an overdose, you need to consider how this happened and what can be done to prevent it from happening again. If this was from a street drug or alcohol, determine if there is a problem that needs addressing. If you are not sure a problems exists, it is easy to talk to a professional and ask them if they think you have a problem. It is better to handle this problem in this way before it happens again and has a much worse consequence.  Document Released: 05/11/2004 Document Revised: 05/20/2011 Document Reviewed: 10/17/2008  [employer]  Patient Information 2015 [employer]. This information is not intended to replace advice given to you by your health care provider. Make sure you discuss any questions you have with your health care provider.

## 2013-11-06 NOTE — ED Provider Notes (Signed)
CSN: [id number]     Arrival date & time 11/05/13  2349 History   First MD Initiated Contact with Patient 11/06/13 [phone]     Chief Complaint  Patient presents with  . Drug Overdose     (Consider location/radiation/quality/duration/timing/severity/associated sxs/prior Treatment) HPI This is a 48 year old female who was preparing her schizophrenic daughter's nighttime medications yesterday evening about half hour before arrival. She also sorted her own evening medications into a pill bottle. She accidentally grabbed her daughters bottle of oxycodone 10 mg and took the pills in it rather than her own pills. A few minutes later she noticed that her pill bottle still contained her evening medications and she became very anxious. She is not sure how many oxycodone tablets were in the bottle but fears it could have been as many as 35. She has had some nausea and itching but has not been somnolent. She denies deliberate attempt at overdose. She admits that it has made her very anxious.  Past Medical History  Diagnosis Date  . DDD (degenerative disc disease)   . Migraine   . Connective tissue disease    Past Surgical History  Procedure Laterality Date  . Appendectomy    . Tonsillectomy    . Uterine sling    . Abdominal hysterectomy    . Bladder suspension    . Vaginalplasty    . Endocele repair    . Anterior and posterior vaginal repair     No family history on file. History  Substance Use Topics  . Smoking status: Never Smoker   . Smokeless tobacco: Not on file  . Alcohol Use: No   OB History   Grav Para Term Preterm Abortions TAB SAB Ect Mult Living                 Review of Systems  All other systems reviewed and are negative.   Allergies  Biaxin; Decadron; Depacon; Depakote; Dhea; Reglan; Toradol; and Ultram  Home Medications   Prior to Admission medications   Medication Sig Start Date End Date Taking? Authorizing Provider  BuPROPion HCl (WELLBUTRIN PO) Take by mouth.     Historical Provider, MD  ClonazePAM (KLONOPIN PO) Take by mouth.    Historical Provider, MD  Esomeprazole Magnesium (NEXIUM PO) Take by mouth.    Historical Provider, MD  fentaNYL (DURAGESIC - DOSED MCG/HR) 50 MCG/HR Place 50 mcg onto the skin every 3 (three) days.    Historical Provider, MD  HYDROcodone-acetaminophen (NORCO/VICODIN) 5-325 MG per tablet Take 1 tablet by mouth every 6 (six) hours as needed for moderate pain.    Historical Provider, MD  hydroxychloroquine (PLAQUENIL) 200 MG tablet Take by mouth daily.    Historical Provider, MD  ondansetron (ZOFRAN ODT) 8 MG disintegrating tablet Take 1 tablet (8 mg total) by mouth every 8 (eight) hours as needed for nausea or vomiting. 06/11/13   Hilario Quarry, MD  Ondansetron HCl (ZOFRAN PO) Take by mouth.    Historical Provider, MD  oxyCODONE-acetaminophen (PERCOCET/ROXICET) 5-325 MG per tablet Take 1 tablet by mouth every 4 (four) hours as needed. 08/16/13   Kristen N Ward, DO  phenazopyridine (PYRIDIUM) 95 MG tablet Take 1 tablet (95 mg total) by mouth 3 (three) times daily as needed for pain. 08/16/13   Kristen N Ward, DO  prednisoLONE 5 MG TABS tablet Take by mouth.    Historical Provider, MD  Prochlorperazine Maleate (COMPAZINE PO) Take by mouth.    Historical Provider, MD  promethazine (PHENERGAN) 25 MG  tablet Take 1 tablet (25 mg total) by mouth every 6 (six) hours as needed for nausea or vomiting. 06/11/13   Hilario Quarry, MD  promethazine (PHENERGAN) 25 MG tablet Take 1 tablet (25 mg total) by mouth every 6 (six) hours as needed for nausea or vomiting. 08/16/13   Kristen N Ward, DO  TIZANIDINE HCL PO Take by mouth.    Historical Provider, MD  topiramate (TOPAMAX) 100 MG tablet Take 100 mg by mouth 2 (two) times daily.    Historical Provider, MD  ZOLMitriptan (ZOMIG PO) Take by mouth.    Historical Provider, MD  Zolpidem Tartrate (AMBIEN PO) Take by mouth.    Historical Provider, MD   BP 105/68  Pulse 78  Temp(Src) 98.4 F (36.9 C) (Oral)   Resp 23  SpO2 93%  Physical Exam General: Well-developed, well-nourished female in no acute distress; appearance consistent with age of record HENT: normocephalic; atraumatic Eyes: pupils equal, round and reactive to light; extraocular muscles intact Neck: supple Heart: regular rate and rhythm Lungs: clear to auscultation bilaterally Abdomen: soft; nondistended; nontender; bowel sounds present Extremities: No deformity; full range of motion; pulses normal; no edema Neurologic: Awake, alert and oriented; motor function intact in all extremities and symmetric; no facial droop Skin: Warm and dry Psychiatric: Anxious; occasionally tearful    ED Course  Procedures (including critical care time)  MDM   Nursing notes and vitals signs, including pulse oximetry, reviewed.  Summary of this visit's results, reviewed by myself:  Labs:  Results for orders placed during the hospital encounter of 11/05/13 (from the past 24 hour(s))  CBC WITH DIFFERENTIAL     Status: None   Collection Time    11/06/13 12:15 AM      Result Value Ref Range   WBC 4.6  4.0 - 10.5 K/uL   RBC 4.52  3.87 - 5.11 MIL/uL   Hemoglobin 13.2  12.0 - 15.0 g/dL   HCT 14.7  82.9 - 56.2 %   MCV 90.9  78.0 - 100.0 fL   MCH 29.2  26.0 - 34.0 pg   MCHC 32.1  30.0 - 36.0 g/dL   RDW 13.0  86.5 - 78.4 %   Platelets 173  150 - 400 K/uL   Neutrophils Relative % 50  43 - 77 %   Neutro Abs 2.3  1.7 - 7.7 K/uL   Lymphocytes Relative 40  12 - 46 %   Lymphs Abs 1.9  0.7 - 4.0 K/uL   Monocytes Relative 8  3 - 12 %   Monocytes Absolute 0.4  0.1 - 1.0 K/uL   Eosinophils Relative 2  0 - 5 %   Eosinophils Absolute 0.1  0.0 - 0.7 K/uL   Basophils Relative 0  0 - 1 %   Basophils Absolute 0.0  0.0 - 0.1 K/uL  COMPREHENSIVE METABOLIC PANEL     Status: Abnormal   Collection Time    11/06/13 12:15 AM      Result Value Ref Range   Sodium 144  137 - 147 mEq/L   Potassium 4.0  3.7 - 5.3 mEq/L   Chloride 107  96 - 112 mEq/L   CO2 26   19 - 32 mEq/L   Glucose, Bld 97  70 - 99 mg/dL   BUN 15  6 - 23 mg/dL   Creatinine, Ser 6.96  0.50 - 1.10 mg/dL   Calcium 9.9  8.4 - 29.5 mg/dL   Total Protein 7.2  6.0 - 8.3 g/dL  Albumin 4.1  3.5 - 5.2 g/dL   AST 21  0 - 37 U/L   ALT 18  0 - 35 U/L   Alkaline Phosphatase 96  39 - 117 U/L   Total Bilirubin <0.2 (*) 0.3 - 1.2 mg/dL   GFR calc non Af Amer 58 (*) >90 mL/min   GFR calc Af Amer 67 (*) >90 mL/min   Anion gap 11  5 - 15  ETHANOL     Status: None   Collection Time    11/06/13 12:15 AM      Result Value Ref Range   Alcohol, Ethyl (B) <11  0 - 11 mg/dL  URINALYSIS, ROUTINE W REFLEX MICROSCOPIC     Status: Abnormal   Collection Time    11/06/13  2:06 AM      Result Value Ref Range   Color, Urine YELLOW  YELLOW   APPearance CLEAR  CLEAR   Specific Gravity, Urine 1.022  1.005 - 1.030   pH 5.0  5.0 - 8.0   Glucose, UA NEGATIVE  NEGATIVE mg/dL   Hgb urine dipstick NEGATIVE  NEGATIVE   Bilirubin Urine SMALL (*) NEGATIVE   Ketones, ur NEGATIVE  NEGATIVE mg/dL   Protein, ur NEGATIVE  NEGATIVE mg/dL   Urobilinogen, UA 0.2  0.0 - 1.0 mg/dL   Nitrite NEGATIVE  NEGATIVE   Leukocytes, UA SMALL (*) NEGATIVE  URINE RAPID DRUG SCREEN (HOSP PERFORMED)     Status: None   Collection Time    11/06/13  2:06 AM      Result Value Ref Range   Opiates NONE DETECTED  NONE DETECTED   Cocaine NONE DETECTED  NONE DETECTED   Benzodiazepines NONE DETECTED  NONE DETECTED   Amphetamines NONE DETECTED  NONE DETECTED   Tetrahydrocannabinol NONE DETECTED  NONE DETECTED   Barbiturates NONE DETECTED  NONE DETECTED  URINE MICROSCOPIC-ADD ON     Status: Abnormal   Collection Time    11/06/13  2:06 AM      Result Value Ref Range   Squamous Epithelial / LPF RARE  RARE   WBC, UA 7-10  <3 WBC/hpf   RBC / HPF 0-2  <3 RBC/hpf   Bacteria, UA FEW (*) RARE   Crystals CA OXALATE CRYSTALS (*) NEGATIVE   Urine-Other MUCOUS PRESENT    ACETAMINOPHEN LEVEL     Status: None   Collection Time    11/06/13   2:17 AM      Result Value Ref Range   Acetaminophen (Tylenol), Serum <15.0  10 - 30 ug/mL  SALICYLATE LEVEL     Status: Abnormal   Collection Time    11/06/13  2:17 AM      Result Value Ref Range   Salicylate Lvl <2.0 (*) 2.8 - 20.0 mg/dL   1:61 AM The patient was observed overnight and has remained stable without somnolence or respiratory depression. It is suspected that the patient did take an accidental overdose of oxycodone that the number of tablets was significantly lower than her estimate.     Hanley Seamen, MD 11/06/13 [phone]

## 2013-11-06 NOTE — ED Notes (Addendum)
Pt CAO and appears to be in NAD. Nausea persists. Pt reminded of need for urine sample.

## 2013-11-07 LAB — URINE CULTURE: Colony Count: 8000

## 2014-01-01 DIAGNOSIS — N3090 Cystitis, unspecified without hematuria: Secondary | ICD-10-CM | POA: Insufficient documentation

## 2014-01-01 DIAGNOSIS — N952 Postmenopausal atrophic vaginitis: Secondary | ICD-10-CM | POA: Insufficient documentation

## 2014-07-23 ENCOUNTER — Emergency Department (HOSPITAL_BASED_OUTPATIENT_CLINIC_OR_DEPARTMENT_OTHER)
Admission: EM | Admit: 2014-07-23 | Discharge: 2014-07-23 | Disposition: A | Discharge status: Home / Self Care | Payer: Commercial Managed Care - PPO | Attending: Emergency Medicine | Admitting: Emergency Medicine

## 2014-07-23 ENCOUNTER — Encounter (HOSPITAL_BASED_OUTPATIENT_CLINIC_OR_DEPARTMENT_OTHER): Payer: Self-pay

## 2014-07-23 DIAGNOSIS — R1033 Periumbilical pain: Secondary | ICD-10-CM | POA: Diagnosis not present

## 2014-07-23 DIAGNOSIS — Z9089 Acquired absence of other organs: Secondary | ICD-10-CM | POA: Insufficient documentation

## 2014-07-23 DIAGNOSIS — G43909 Migraine, unspecified, not intractable, without status migrainosus: Secondary | ICD-10-CM | POA: Diagnosis not present

## 2014-07-23 DIAGNOSIS — G8929 Other chronic pain: Secondary | ICD-10-CM | POA: Insufficient documentation

## 2014-07-23 DIAGNOSIS — Z8739 Personal history of other diseases of the musculoskeletal system and connective tissue: Secondary | ICD-10-CM | POA: Insufficient documentation

## 2014-07-23 DIAGNOSIS — Z9889 Other specified postprocedural states: Secondary | ICD-10-CM | POA: Diagnosis not present

## 2014-07-23 DIAGNOSIS — R112 Nausea with vomiting, unspecified: Secondary | ICD-10-CM | POA: Diagnosis not present

## 2014-07-23 DIAGNOSIS — Z79899 Other long term (current) drug therapy: Secondary | ICD-10-CM | POA: Diagnosis not present

## 2014-07-23 DIAGNOSIS — Z90710 Acquired absence of both cervix and uterus: Secondary | ICD-10-CM | POA: Diagnosis not present

## 2014-07-23 LAB — CBC WITH DIFFERENTIAL/PLATELET
BASOS ABS: 0 10*3/uL (ref 0.0–0.1)
Basophils Relative: 0 % (ref 0–1)
Eosinophils Absolute: 0 10*3/uL (ref 0.0–0.7)
Eosinophils Relative: 0 % (ref 0–5)
HCT: 43.9 % (ref 36.0–46.0)
Hemoglobin: 14.4 g/dL (ref 12.0–15.0)
Lymphocytes Relative: 6 % — ABNORMAL LOW (ref 12–46)
Lymphs Abs: 0.4 10*3/uL — ABNORMAL LOW (ref 0.7–4.0)
MCH: 28.3 pg (ref 26.0–34.0)
MCHC: 32.8 g/dL (ref 30.0–36.0)
MCV: 86.2 fL (ref 78.0–100.0)
MONO ABS: 0.1 10*3/uL (ref 0.1–1.0)
Monocytes Relative: 2 % — ABNORMAL LOW (ref 3–12)
Neutro Abs: 5.6 10*3/uL (ref 1.7–7.7)
Neutrophils Relative %: 92 % — ABNORMAL HIGH (ref 43–77)
PLATELETS: 150 10*3/uL (ref 150–400)
RBC: 5.09 MIL/uL (ref 3.87–5.11)
RDW: 12.7 % (ref 11.5–15.5)
WBC: 6.1 10*3/uL (ref 4.0–10.5)

## 2014-07-23 LAB — LIPASE, BLOOD: LIPASE: 21 U/L — AB (ref 22–51)

## 2014-07-23 LAB — COMPREHENSIVE METABOLIC PANEL
ALK PHOS: 84 U/L (ref 38–126)
ALT: 11 U/L — AB (ref 14–54)
AST: 17 U/L (ref 15–41)
Albumin: 4.9 g/dL (ref 3.5–5.0)
Anion gap: 13 (ref 5–15)
BUN: 11 mg/dL (ref 6–20)
CHLORIDE: 108 mmol/L (ref 101–111)
CO2: 18 mmol/L — AB (ref 22–32)
Calcium: 9.8 mg/dL (ref 8.9–10.3)
Creatinine, Ser: 0.83 mg/dL (ref 0.44–1.00)
GFR calc non Af Amer: >60 mL/min (ref >60)
GLUCOSE: 137 mg/dL — AB (ref 65–99)
Potassium: 3.7 mmol/L (ref 3.5–5.1)
Sodium: 139 mmol/L (ref 135–145)
Total Bilirubin: 0.6 mg/dL (ref 0.3–1.2)
Total Protein: 7.4 g/dL (ref 6.5–8.1)

## 2014-07-23 LAB — URINALYSIS, ROUTINE W REFLEX MICROSCOPIC
BILIRUBIN URINE: NEGATIVE
Glucose, UA: 100 mg/dL — AB
HGB URINE DIPSTICK: NEGATIVE
Ketones, ur: >80 mg/dL — AB
Nitrite: NEGATIVE
PH: 8 (ref 5.0–8.0)
Protein, ur: 30 mg/dL — AB
Specific Gravity, Urine: 1.018 (ref 1.005–1.030)
UROBILINOGEN UA: 0.2 mg/dL (ref 0.0–1.0)

## 2014-07-23 LAB — URINE MICROSCOPIC-ADD ON

## 2014-07-23 MED ORDER — HYDROMORPHONE HCL 1 MG/ML IJ SOLN
1.0000 mg | Freq: Once | INTRAMUSCULAR | Status: AC
  Administered 2014-07-23: 1 mg via INTRAVENOUS
  Filled 2014-07-23: qty 1

## 2014-07-23 MED ORDER — METOCLOPRAMIDE HCL 10 MG PO TABS: 10.0000 mg | ORAL_TABLET | Freq: Four times a day (QID) | ORAL | Status: DC | PRN

## 2014-07-23 MED ORDER — PROMETHAZINE HCL 25 MG/ML IJ SOLN
25.0000 mg | Freq: Once | INTRAMUSCULAR | Status: AC
  Administered 2014-07-23: 25 mg via INTRAVENOUS
  Filled 2014-07-23: qty 1

## 2014-07-23 MED ORDER — ONDANSETRON 4 MG PO TBDP: ORAL_TABLET | ORAL | Status: AC

## 2014-07-23 MED ORDER — ONDANSETRON HCL 4 MG/2ML IJ SOLN
4.0000 mg | Freq: Once | INTRAMUSCULAR | Status: AC
  Administered 2014-07-23: 4 mg via INTRAVENOUS
  Filled 2014-07-23: qty 2

## 2014-07-23 MED ORDER — DICYCLOMINE HCL 10 MG/ML IM SOLN
20.0000 mg | Freq: Once | INTRAMUSCULAR | Status: AC
  Administered 2014-07-23: 20 mg via INTRAMUSCULAR
  Filled 2014-07-23: qty 2

## 2014-07-23 MED ORDER — SODIUM CHLORIDE 0.9 % IV BOLUS (SEPSIS)
1000.0000 mL | Freq: Once | INTRAVENOUS | Status: AC
  Administered 2014-07-23: 1000 mL via INTRAVENOUS

## 2014-07-23 NOTE — ED Notes (Signed)
Patient sipping diet ginger ale

## 2014-07-23 NOTE — ED Provider Notes (Signed)
CSN: [id number]     Arrival date & time 07/23/14  0804 History   First MD Initiated Contact with Patient 07/23/14 [phone]     Chief Complaint  Patient presents with  . Emesis     (Consider location/radiation/quality/duration/timing/severity/associated sxs/prior Treatment) Patient is a 49 y.o. female presenting with vomiting. The history is provided by the patient. No language interpreter was used.  Emesis Severity:  Moderate Duration:  12 hours Timing:  Intermittent Number of daily episodes:  15 Quality:  Stomach contents Progression:  Unchanged Chronicity:  Chronic Recent urination:  Normal Relieved by:  Nothing Exacerbated by: By mouth intake. Ineffective treatments:  None tried Associated symptoms: abdominal pain   Associated symptoms: no arthralgias, no chills, no diarrhea, no fever, no headaches and no sore throat   Associated symptoms comment:  Nausea and vomiting Abdominal pain:    Location:  Periumbilical   Quality:  Aching and cramping   Severity:  Severe   Onset quality:  Sudden   Duration:  13 hours   Timing:  Intermittent   Progression:  Waxing and waning   Chronicity:  Chronic Risk factors: prior abdominal surgery     Past Medical History  Diagnosis Date  . DDD (degenerative disc disease)   . Migraine   . Connective tissue disease    Past Surgical History  Procedure Laterality Date  . Appendectomy    . Tonsillectomy    . Uterine sling    . Abdominal hysterectomy    . Bladder suspension    . Vaginalplasty    . Endocele repair    . Anterior and posterior vaginal repair     No family history on file. History  Substance Use Topics  . Smoking status: Never Smoker   . Smokeless tobacco: Not on file  . Alcohol Use: No   OB History    No data available     Review of Systems  Constitutional: Negative for fever, chills, diaphoresis, activity change, appetite change and fatigue.  HENT: Negative for congestion, facial swelling, rhinorrhea and sore  throat.   Eyes: Negative for photophobia and discharge.  Respiratory: Negative for cough, chest tightness and shortness of breath.   Cardiovascular: Negative for chest pain, palpitations and leg swelling.  Gastrointestinal: Positive for vomiting and abdominal pain. Negative for nausea and diarrhea.  Endocrine: Negative for polydipsia and polyuria.  Genitourinary: Negative for dysuria, frequency, difficulty urinating and pelvic pain.  Musculoskeletal: Negative for back pain, arthralgias, neck pain and neck stiffness.  Skin: Negative for color change and wound.  Allergic/Immunologic: Negative for immunocompromised state.  Neurological: Negative for facial asymmetry, weakness, numbness and headaches.  Hematological: Does not bruise/bleed easily.  Psychiatric/Behavioral: Negative for confusion and agitation.      Allergies  Biaxin; Decadron; Depacon; Depakote; Dhea; Reglan; Toradol; and Ultram  Home Medications   Prior to Admission medications   Medication Sig Start Date End Date Taking? Authorizing Provider  zonisamide (ZONEGRAN) 100 MG capsule Take 100 mg by mouth daily.   Yes Historical Provider, MD  BuPROPion HCl (WELLBUTRIN PO) Take by mouth.    Historical Provider, MD  ClonazePAM (KLONOPIN PO) Take by mouth.    Historical Provider, MD  Esomeprazole Magnesium (NEXIUM PO) Take by mouth.    Historical Provider, MD  fentaNYL (DURAGESIC - DOSED MCG/HR) 50 MCG/HR Place 50 mcg onto the skin every 3 (three) days.    Historical Provider, MD  HYDROcodone-acetaminophen (NORCO/VICODIN) 5-325 MG per tablet Take 1 tablet by mouth every 6 (six) hours as  needed for moderate pain.    Historical Provider, MD  hydroxychloroquine (PLAQUENIL) 200 MG tablet Take by mouth daily.    Historical Provider, MD  metoCLOPramide (REGLAN) 10 MG tablet Take 1 tablet (10 mg total) by mouth every 6 (six) hours as needed for nausea or vomiting. 07/23/14   Toy Cookey, MD  ondansetron (ZOFRAN ODT) 4 MG disintegrating  tablet  ODT q4 hours prn nausea/vomit 07/23/14   Toy Cookey, MD  Ondansetron HCl (ZOFRAN PO) Take by mouth.    Historical Provider, MD  oxyCODONE-acetaminophen (PERCOCET/ROXICET) 5-325 MG per tablet Take 1 tablet by mouth every 4 (four) hours as needed. 08/16/13   Kristen N Ward, DO  phenazopyridine (PYRIDIUM) 95 MG tablet Take 1 tablet (95 mg total) by mouth 3 (three) times daily as needed for pain. 08/16/13   Kristen N Ward, DO  Prochlorperazine Maleate (COMPAZINE PO) Take by mouth.    Historical Provider, MD  promethazine (PHENERGAN) 25 MG tablet Take 1 tablet (25 mg total) by mouth every 6 (six) hours as needed for nausea or vomiting. 06/11/13   Margarita Grizzle, MD  promethazine (PHENERGAN) 25 MG tablet Take 1 tablet (25 mg total) by mouth every 6 (six) hours as needed for nausea or vomiting. 08/16/13   Kristen N Ward, DO  TIZANIDINE HCL PO Take by mouth.    Historical Provider, MD  topiramate (TOPAMAX) 100 MG tablet Take 100 mg by mouth 2 (two) times daily.    Historical Provider, MD  ZOLMitriptan (ZOMIG PO) Take by mouth.    Historical Provider, MD  Zolpidem Tartrate (AMBIEN PO) Take by mouth.    Historical Provider, MD   BP 125/71 mmHg  Pulse 72  Temp(Src) 98.1 F (36.7 C) (Oral)  Resp 18  Ht  (1.727 m)  Wt 150 lb (68.04 kg)  BMI 22.81 kg/m2  SpO2 100% Physical Exam  Constitutional: She is oriented to person, place, and time. She appears well-developed and well-nourished. No distress.  HENT:  Head: Normocephalic and atraumatic.  Mouth/Throat: No oropharyngeal exudate.  Eyes: Pupils are equal, round, and reactive to light.  Neck: Normal range of motion. Neck supple.  Cardiovascular: Normal rate, regular rhythm and normal heart sounds.  Exam reveals no gallop and no friction rub.   No murmur heard. Pulmonary/Chest: Effort normal and breath sounds normal. No respiratory distress. She has no wheezes. She has no rales.  Abdominal: Soft. Bowel sounds are normal. She exhibits no  distension and no mass. There is tenderness in the periumbilical area. There is no rigidity, no rebound and no guarding.  Musculoskeletal: Normal range of motion. She exhibits no edema or tenderness.  Neurological: She is alert and oriented to person, place, and time.  Skin: Skin is warm and dry.  Psychiatric: She has a normal mood and affect.    ED Course  Procedures (including critical care time) Labs Review Labs Reviewed  URINALYSIS, ROUTINE W REFLEX MICROSCOPIC - Abnormal; Notable for the following:    APPearance TURBID (*)    Glucose, UA 100 (*)    Ketones, ur >80 (*)    Protein, ur 30 (*)    Leukocytes, UA SMALL (*)    All other components within normal limits  CBC WITH DIFFERENTIAL/PLATELET - Abnormal; Notable for the following:    Neutrophils Relative % 92 (*)    Lymphocytes Relative 6 (*)    Lymphs Abs 0.4 (*)    Monocytes Relative 2 (*)    All other components within normal limits  COMPREHENSIVE  METABOLIC PANEL - Abnormal; Notable for the following:    CO2 18 (*)    Glucose, Bld 137 (*)    ALT 11 (*)    All other components within normal limits  LIPASE, BLOOD - Abnormal; Notable for the following:    Lipase 21 (*)    All other components within normal limits  URINE MICROSCOPIC-ADD ON    Imaging Review No results found.   EKG Interpretation None      MDM   Final diagnoses:  Periumbilical abdominal pain  Non-intractable vomiting with nausea, vomiting of unspecified type    Pt is a 49 y.o. female with Pmhx as above who presents with periumbilical abdominal cramping, n/v.  She reports many similar episodes over the last 2-3 years, multiple episodes resulting in hospital admissions for intractable nausea and vomiting, without clear cause being found.  She reports multiple abdominal imaging studies, EGD, has not been able to tolerate prep in the past for colonoscopy.  She follows with GI.  [facility].  She denies fevers, chills, hematemesis, diarrhea,  constipation or blood in stool, dysuria, hematuria or frequency.  Episode is similar to prior.  She has not been able to keep down her chronic pain meds, or anti-emetics at home.  On physical exam, vital signs stable.  Patient is uncomfortable but in no acute distress, she has periumbilical tenderness without rebound or guarding.  Bowel sounds are normal.  Abdomen soft and non distended.    CBC, CMP, lipase unremarkable.  Urine not likely infected with only small leukocytes.  Patient feeling much improved after 2 L normal saline, 2 doses of IV Zofran and Dilaudid.  I do not feel imaging needed this point, given that she has had multiple similar episodes.  I've encouraged her to follow up with GI for consideration for colonoscopy.  She states that she has home pain medicine.  I will give her ODT Zofran   Lebron QuamHilda D Guglielmo evaluation in the Emergency Department is complete. It has been determined that no acute conditions requiring further emergency intervention are present at this time. The patient/guardian have been advised of the diagnosis and plan. We have discussed signs and symptoms that warrant return to the ED, such as changes or worsening in symptoms, worsening pain, fevers, inability to tolerate liquids    Toy CookeyMegan Docherty, MD 07/24/14 [phone]

## 2014-07-23 NOTE — Discharge Instructions (Signed)
Abdominal Pain, Women °Abdominal (stomach, pelvic, or belly) pain can be caused by many things. It is important to tell your doctor: °· The location of the pain. °· Does it come and go or is it present all the time? °· Are there things that start the pain (eating certain foods, exercise)? °· Are there other symptoms associated with the pain (fever, nausea, vomiting, diarrhea)? °All of this is helpful to know when trying to find the cause of the pain. °CAUSES  °· Stomach: virus or bacteria infection, or ulcer. °· Intestine: appendicitis (inflamed appendix), regional ileitis (Crohn's disease), ulcerative colitis (inflamed colon), irritable bowel syndrome, diverticulitis (inflamed diverticulum of the colon), or cancer of the stomach or intestine. °· Gallbladder disease or stones in the gallbladder. °· Kidney disease, kidney stones, or infection. °· Pancreas infection or cancer. °· Fibromyalgia (pain disorder). °· Diseases of the female organs: °¨ Uterus: fibroid (non-cancerous) tumors or infection. °¨ Fallopian tubes: infection or tubal pregnancy. °¨ Ovary: cysts or tumors. °¨ Pelvic adhesions (scar tissue). °¨ Endometriosis (uterus lining tissue growing in the pelvis and on the pelvic organs). °¨ Pelvic congestion syndrome (female organs filling up with blood just before the menstrual period). °¨ Pain with the menstrual period. °¨ Pain with ovulation (producing an egg). °¨ Pain with an IUD (intrauterine device, birth control) in the uterus. °¨ Cancer of the female organs. °· Functional pain (pain not caused by a disease, may improve without treatment). °· Psychological pain. °· Depression. °DIAGNOSIS  °Your doctor will decide the seriousness of your pain by doing an examination. °· Blood tests. °· X-rays. °· Ultrasound. °· CT scan (computed tomography, special type of X-ray). °· MRI (magnetic resonance imaging). °· Cultures, for infection. °· Barium enema (dye inserted in the large intestine, to better view it with  X-rays). °· Colonoscopy (looking in intestine with a lighted tube). °· Laparoscopy (minor surgery, looking in abdomen with a lighted tube). °· Major abdominal exploratory surgery (looking in abdomen with a large incision). °TREATMENT  °The treatment will depend on the cause of the pain.  °· Many cases can be observed and treated at home. °· Over-the-counter medicines recommended by your caregiver. °· Prescription medicine. °· Antibiotics, for infection. °· Birth control pills, for painful periods or for ovulation pain. °· Hormone treatment, for endometriosis. °· Nerve blocking injections. °· Physical therapy. °· Antidepressants. °· Counseling with a psychologist or psychiatrist. °· Minor or major surgery. °HOME CARE INSTRUCTIONS  °· Do not take laxatives, unless directed by your caregiver. °· Take over-the-counter pain medicine only if ordered by your caregiver. Do not take aspirin because it can cause an upset stomach or bleeding. °· Try a clear liquid diet (broth or water) as ordered by your caregiver. Slowly move to a bland diet, as tolerated, if the pain is related to the stomach or intestine. °· Have a thermometer and take your temperature several times a day, and record it. °· Bed rest and sleep, if it helps the pain. °· Avoid sexual intercourse, if it causes pain. °· Avoid stressful situations. °· Keep your follow-up appointments and tests, as your caregiver orders. °· If the pain does not go away with medicine or surgery, you may try: °¨ Acupuncture. °¨ Relaxation exercises (yoga, meditation). °¨ Group therapy. °¨ Counseling. °SEEK MEDICAL CARE IF:  °· You notice certain foods cause stomach pain. °· Your home care treatment is not helping your pain. °· You need stronger pain medicine. °· You want your IUD removed. °· You feel faint or   lightheaded. °· You develop nausea and vomiting. °· You develop a rash. °· You are having side effects or an allergy to your medicine. °SEEK IMMEDIATE MEDICAL CARE IF:  °· Your  pain does not go away or gets worse. °· You have a fever. °· Your pain is felt only in portions of the abdomen. The right side could possibly be appendicitis. The left lower portion of the abdomen could be colitis or diverticulitis. °· You are passing blood in your stools (bright red or black tarry stools, with or without vomiting). °· You have blood in your urine. °· You develop chills, with or without a fever. °· You pass out. °MAKE SURE YOU:  °· Understand these instructions. °· Will watch your condition. °· Will get help right away if you are not doing well or get worse. °Document Released: 12/23/2006 Document Revised: 07/12/2013 Document Reviewed: 01/12/2009 °[employer]® Patient Information ©2015 [employer]. This information is not intended to replace advice given to you by your health care provider. Make sure you discuss any questions you have with your health care provider. ° °

## 2014-07-23 NOTE — ED Notes (Addendum)
Patient here with nausea and vomiting that started yesterday afternoon. Reports that she has had same in past with hospitalization and no diagnosis made. Complains of ongoing abdominal pain with same, no diarrhea

## 2014-07-23 NOTE — ED Notes (Signed)
Patient sleeping

## 2014-10-15 ENCOUNTER — Encounter (HOSPITAL_BASED_OUTPATIENT_CLINIC_OR_DEPARTMENT_OTHER): Payer: Self-pay | Admitting: Emergency Medicine

## 2014-10-15 ENCOUNTER — Emergency Department (HOSPITAL_BASED_OUTPATIENT_CLINIC_OR_DEPARTMENT_OTHER)
Admission: EM | Admit: 2014-10-15 | Discharge: 2014-10-16 | Disposition: A | Discharge status: Home / Self Care | Payer: Commercial Managed Care - PPO | Attending: Emergency Medicine | Admitting: Emergency Medicine

## 2014-10-15 DIAGNOSIS — G43909 Migraine, unspecified, not intractable, without status migrainosus: Secondary | ICD-10-CM | POA: Diagnosis not present

## 2014-10-15 DIAGNOSIS — Z8739 Personal history of other diseases of the musculoskeletal system and connective tissue: Secondary | ICD-10-CM | POA: Diagnosis not present

## 2014-10-15 DIAGNOSIS — R112 Nausea with vomiting, unspecified: Secondary | ICD-10-CM | POA: Diagnosis not present

## 2014-10-15 DIAGNOSIS — R1013 Epigastric pain: Secondary | ICD-10-CM | POA: Diagnosis not present

## 2014-10-15 DIAGNOSIS — Z79899 Other long term (current) drug therapy: Secondary | ICD-10-CM | POA: Diagnosis not present

## 2014-10-15 LAB — CBC WITH DIFFERENTIAL/PLATELET
BASOS ABS: 0 10*3/uL (ref 0.0–0.1)
Basophils Relative: 0 % (ref 0–1)
Eosinophils Absolute: 0 10*3/uL (ref 0.0–0.7)
Eosinophils Relative: 0 % (ref 0–5)
HCT: 43.1 % (ref 36.0–46.0)
HEMOGLOBIN: 13.9 g/dL (ref 12.0–15.0)
LYMPHS ABS: 0.3 10*3/uL — AB (ref 0.7–4.0)
LYMPHS PCT: 4 % — AB (ref 12–46)
MCH: 27.9 pg (ref 26.0–34.0)
MCHC: 32.3 g/dL (ref 30.0–36.0)
MCV: 86.5 fL (ref 78.0–100.0)
MONOS PCT: 2 % — AB (ref 3–12)
Monocytes Absolute: 0.2 10*3/uL (ref 0.1–1.0)
NEUTROS ABS: 7.9 10*3/uL — AB (ref 1.7–7.7)
Neutrophils Relative %: 94 % — ABNORMAL HIGH (ref 43–77)
Platelets: 168 10*3/uL (ref 150–400)
RBC: 4.98 MIL/uL (ref 3.87–5.11)
RDW: 12.8 % (ref 11.5–15.5)
WBC: 8.3 10*3/uL (ref 4.0–10.5)

## 2014-10-15 LAB — COMPREHENSIVE METABOLIC PANEL
ALBUMIN: 4.5 g/dL (ref 3.5–5.0)
ALT: 12 U/L — ABNORMAL LOW (ref 14–54)
AST: 18 U/L (ref 15–41)
Alkaline Phosphatase: 86 U/L (ref 38–126)
Anion gap: 10 (ref 5–15)
BILIRUBIN TOTAL: 0.4 mg/dL (ref 0.3–1.2)
BUN: 13 mg/dL (ref 6–20)
CALCIUM: 9.7 mg/dL (ref 8.9–10.3)
CHLORIDE: 106 mmol/L (ref 101–111)
CO2: 23 mmol/L (ref 22–32)
Creatinine, Ser: 0.89 mg/dL (ref 0.44–1.00)
GFR calc Af Amer: >60 mL/min (ref >60)
GFR calc non Af Amer: >60 mL/min (ref >60)
Glucose, Bld: 141 mg/dL — ABNORMAL HIGH (ref 65–99)
Potassium: 3.5 mmol/L (ref 3.5–5.1)
SODIUM: 139 mmol/L (ref 135–145)
Total Protein: 7.3 g/dL (ref 6.5–8.1)

## 2014-10-15 LAB — LIPASE, BLOOD: Lipase: 13 U/L — ABNORMAL LOW (ref 22–51)

## 2014-10-15 MED ORDER — MORPHINE SULFATE 4 MG/ML IJ SOLN
4.0000 mg | INTRAMUSCULAR | Status: DC | PRN
  Administered 2014-10-15 (×2): 4 mg via INTRAVENOUS
  Filled 2014-10-15 (×2): qty 1

## 2014-10-15 MED ORDER — SODIUM CHLORIDE 0.9 % IV BOLUS (SEPSIS)
500.0000 mL | Freq: Once | INTRAVENOUS | Status: AC
  Administered 2014-10-15: 500 mL via INTRAVENOUS

## 2014-10-15 MED ORDER — SODIUM CHLORIDE 0.9 % IV BOLUS (SEPSIS)
1000.0000 mL | Freq: Once | INTRAVENOUS | Status: AC
  Administered 2014-10-15: 1000 mL via INTRAVENOUS

## 2014-10-15 MED ORDER — METOCLOPRAMIDE HCL 10 MG PO TABS: 10.0000 mg | ORAL_TABLET | Freq: Four times a day (QID) | ORAL | Status: AC

## 2014-10-15 MED ORDER — PROCHLORPERAZINE EDISYLATE 5 MG/ML IJ SOLN
10.0000 mg | Freq: Once | INTRAMUSCULAR | Status: AC
  Administered 2014-10-15: 10 mg via INTRAVENOUS
  Filled 2014-10-15: qty 2

## 2014-10-15 MED ORDER — PROMETHAZINE HCL 25 MG RE SUPP: 25.0000 mg | Freq: Four times a day (QID) | RECTAL | Status: AC | PRN

## 2014-10-15 MED ORDER — PROMETHAZINE HCL 25 MG/ML IJ SOLN
25.0000 mg | Freq: Once | INTRAMUSCULAR | Status: AC
  Administered 2014-10-15: 25 mg via INTRAMUSCULAR
  Filled 2014-10-15: qty 1

## 2014-10-15 MED ORDER — LORAZEPAM 2 MG/ML IJ SOLN
1.0000 mg | Freq: Once | INTRAMUSCULAR | Status: AC
Start: 2014-10-15 — End: 2014-10-15
  Administered 2014-10-15: 1 mg via INTRAVENOUS
  Filled 2014-10-15: qty 1

## 2014-10-15 MED ORDER — ONDANSETRON 4 MG PO TBDP
4.0000 mg | ORAL_TABLET | Freq: Once | ORAL | Status: AC | PRN
  Administered 2014-10-15: 4 mg via ORAL
  Filled 2014-10-15: qty 1

## 2014-10-15 MED ORDER — ONDANSETRON HCL 4 MG/2ML IJ SOLN
4.0000 mg | Freq: Once | INTRAMUSCULAR | Status: AC
  Administered 2014-10-15: 4 mg via INTRAVENOUS
  Filled 2014-10-15: qty 2

## 2014-10-15 NOTE — ED Notes (Signed)
Ginger Ale given as fluid challenge.  

## 2014-10-15 NOTE — Discharge Instructions (Signed)

## 2014-10-15 NOTE — ED Notes (Signed)
Pt in c/o abdominal pain since 0430 this am associated with emesis.

## 2014-10-15 NOTE — ED Notes (Signed)
IV attempt x 2 unsuccessful.

## 2014-10-15 NOTE — ED Notes (Signed)
Pt cont to c/o abd pain, EDP notified

## 2014-10-15 NOTE — ED Notes (Signed)
Ultrasound placed at bedside per EDP request

## 2014-10-15 NOTE — ED Notes (Signed)
Pt restless, hyperventilating, c/o 10/10 pain and nausea, orders received.

## 2014-10-15 NOTE — ED Notes (Signed)
Pain and nausea decreased, EDPA into room. Pt alert, NAD, calm, interactive. VSS.

## 2014-10-15 NOTE — ED Provider Notes (Signed)
CSN: [id number]     Arrival date & time 10/15/14  1641 History   First MD Initiated Contact with Patient 10/15/14 1713     Chief Complaint  Patient presents with  . Abdominal Pain  . Emesis     (Consider location/radiation/quality/duration/timing/severity/associated sxs/prior Treatment) HPI  Blood pressure 160/87, pulse 78, temperature 98.2 F (36.8 C), temperature source Oral, resp. rate 22, SpO2 100 %.  April Rhodes is a 49 y.o. female complaining of 15 episodes of nonbloody, nonbilious, no coffee-ground emesis with epigastric abdominal pain onset at 04:30 today. States she had an episode of diarrhea with initial onset of vomiting, she had a normally formed stool at 6 PM. Pain is severe, 10 out of 10, patient denies fever, chills, diarrhea. States she's had multiple similar exacerbations in the past which normally require admission, she seen gastroenterology in [facility] with no definitive diagnosis.  Past Medical History  Diagnosis Date  . DDD (degenerative disc disease)   . Migraine   . Connective tissue disease    Past Surgical History  Procedure Laterality Date  . Appendectomy    . Tonsillectomy    . Uterine sling    . Abdominal hysterectomy    . Bladder suspension    . Vaginalplasty    . Endocele repair    . Anterior and posterior vaginal repair     History reviewed. No pertinent family history. History  Substance Use Topics  . Smoking status: Never Smoker   . Smokeless tobacco: Not on file  . Alcohol Use: No   OB History    No data available     Review of Systems  10 systems reviewed and found to be negative, except as noted in the HPI.   Allergies  Biaxin; Decadron; Depacon; Depakote; Dhea; Toradol; Ultram; and Reglan  Home Medications   Prior to Admission medications   Medication Sig Start Date End Date Taking? Authorizing Provider  BuPROPion HCl (WELLBUTRIN PO) Take by mouth.    Historical Provider, MD  ClonazePAM (KLONOPIN PO) Take by mouth.     Historical Provider, MD  Esomeprazole Magnesium (NEXIUM PO) Take by mouth.    Historical Provider, MD  fentaNYL (DURAGESIC - DOSED MCG/HR) 50 MCG/HR Place 50 mcg onto the skin every 3 (three) days.    Historical Provider, MD  HYDROcodone-acetaminophen (NORCO/VICODIN) 5-325 MG per tablet Take 1 tablet by mouth every 6 (six) hours as needed for moderate pain.    Historical Provider, MD  hydroxychloroquine (PLAQUENIL) 200 MG tablet Take by mouth daily.    Historical Provider, MD  metoCLOPramide (REGLAN) 10 MG tablet Take 1 tablet (10 mg total) by mouth every 6 (six) hours. 10/15/14   Camrin Lapre, PA-C  ondansetron (ZOFRAN ODT) 4 MG disintegrating tablet 4mg  ODT q4 hours prn nausea/vomit 07/23/14   Toy Cookey, MD  Ondansetron HCl (ZOFRAN PO) Take by mouth.    Historical Provider, MD  oxyCODONE-acetaminophen (PERCOCET/ROXICET) 5-325 MG per tablet Take 1 tablet by mouth every 4 (four) hours as needed. 08/16/13   Kristen N Ward, DO  phenazopyridine (PYRIDIUM) 95 MG tablet Take 1 tablet (95 mg total) by mouth 3 (three) times daily as needed for pain. 08/16/13   Kristen N Ward, DO  Prochlorperazine Maleate (COMPAZINE PO) Take by mouth.    Historical Provider, MD  promethazine (PHENERGAN) 25 MG suppository Place 1 suppository (25 mg total) rectally every 6 (six) hours as needed for nausea or vomiting. 10/15/14   Nova Evett, PA-C  TIZANIDINE HCL PO Take  by mouth.    Historical Provider, MD  topiramate (TOPAMAX) 100 MG tablet Take 100 mg by mouth 2 (two) times daily.    Historical Provider, MD  ZOLMitriptan (ZOMIG PO) Take by mouth.    Historical Provider, MD  Zolpidem Tartrate (AMBIEN PO) Take by mouth.    Historical Provider, MD  zonisamide (ZONEGRAN) 100 MG capsule Take 100 mg by mouth daily.    Historical Provider, MD   BP 147/85 mmHg  Pulse 93  Temp(Src) 98.2 F (36.8 C) (Oral)  Resp 20  SpO2 92% Physical Exam  Constitutional: She is oriented to person, place, and time. She appears  well-developed and well-nourished. No distress.  HENT:  Head: Normocephalic.  Mouth/Throat: Oropharynx is clear and moist.  Eyes: Conjunctivae and EOM are normal.  Cardiovascular: Normal rate.   Pulmonary/Chest: Effort normal and breath sounds normal. No stridor. No respiratory distress. She has no wheezes. She has no rales. She exhibits no tenderness.  Abdominal: Soft. Bowel sounds are normal. She exhibits no distension and no mass. There is tenderness. There is no rebound and no guarding.  Fentanyl patch in the right lower quadrant   Tenderness to palpation to the epigastrium with no guarding or rebound  Musculoskeletal: Normal range of motion.  Neurological: She is alert and oriented to person, place, and time.  Psychiatric: She has a normal mood and affect.  Nursing note and vitals reviewed.   ED Course  Procedures (including critical care time) Labs Review Labs Reviewed  COMPREHENSIVE METABOLIC PANEL - Abnormal; Notable for the following:    Glucose, Bld 141 (*)    ALT 12 (*)    All other components within normal limits  LIPASE, BLOOD - Abnormal; Notable for the following:    Lipase 13 (*)    All other components within normal limits  CBC WITH DIFFERENTIAL/PLATELET - Abnormal; Notable for the following:    Neutrophils Relative % 94 (*)    Neutro Abs 7.9 (*)    Lymphocytes Relative 4 (*)    Lymphs Abs 0.3 (*)    Monocytes Relative 2 (*)    All other components within normal limits  URINALYSIS, ROUTINE W REFLEX MICROSCOPIC (NOT AT [facility])    Imaging Review No results found.   EKG Interpretation None      MDM   Final diagnoses:  Non-intractable vomiting with nausea, vomiting of unspecified type  Epigastric abdominal pain   Filed Vitals:   10/15/14 2230 10/15/14 2300 10/15/14 2310 10/15/14 2320  BP: 145/91 147/85    Pulse: 98 94 94 93  Temp:      TempSrc:      Resp:      SpO2: 93% 91% 91% 92%    Medications  morphine 4 MG/ML injection 4 mg (4 mg  Intravenous Given 10/15/14 2109)  ondansetron (ZOFRAN-ODT) disintegrating tablet 4 mg (4 mg Oral Given 10/15/14 1655)  sodium chloride 0.9 % bolus 500 mL (0 mLs Intravenous Stopped 10/15/14 2045)  ondansetron (ZOFRAN) injection 4 mg (4 mg Intravenous Given 10/15/14 1948)  promethazine (PHENERGAN) injection 25 mg (25 mg Intramuscular Given 10/15/14 1748)  LORazepam (ATIVAN) injection 1 mg (1 mg Intravenous Given 10/15/14 2018)  prochlorperazine (COMPAZINE) injection 10 mg (10 mg Intravenous Given 10/15/14 2049)  sodium chloride 0.9 % bolus 1,000 mL (0 mLs Intravenous Stopped 10/15/14 2110)    April Rhodes is a pleasant 49 y.o. female presenting with epigastric pain and multiple episodes of emesis. AFVSS. Abdominal exam is nonsurgical, difficulty obtaining IV access in  this patient. Patient is given Zofran ODT and Phenergan IM with little relief.  Ultrasound-guided IV is placed by attending. Patient has received morphine, Zofran ODT, Zofran IV, Phenergan IM and is still in pain and vomiting. Will try 1 mg of Ativan.  Blood work reassuring with no leukocytosis or significant electrolyte abnormality.  Patient did not find Ativan helpful. Blood work with no significant abnormalities. States that Compazine is worked in the past, we'll try IV Compazine. She may need admission for intractable vomiting.   Her second dose of morphine and Compazine patient reports that she feels significantly better, we'll try by mouth challenge.  Patient has been able to keep down ginger ale. Repeat abdominal exam remains nonsurgical, vital signs remained stable. I've advised her to follow closely with her gastroenterologist which had an extensive discussion of return precautions.   Evaluation does not show pathology that would require ongoing emergent intervention or inpatient treatment. Pt is hemodynamically stable and mentating appropriately. Discussed findings and plan with patient/guardian, who agrees with care plan. All  questions answered. Return precautions discussed and outpatient follow up given.   New Prescriptions   METOCLOPRAMIDE (REGLAN) 10 MG TABLET    Take 1 tablet (10 mg total) by mouth every 6 (six) hours.   PROMETHAZINE (PHENERGAN) 25 MG SUPPOSITORY    Place 1 suppository (25 mg total) rectally every 6 (six) hours as needed for nausea or vomiting.        Wynetta Emery, PA-C 10/15/14 2356  Rolland Porter, MD 10/23/14 [phone]

## 2014-10-15 NOTE — ED Notes (Signed)
Attempted IV/ blood drawn attempted x 4, EDP informed

## 2014-10-15 NOTE — ED Notes (Signed)
Presents with N/V with onset at 0430hrs today

## 2014-10-18 ENCOUNTER — Encounter (HOSPITAL_BASED_OUTPATIENT_CLINIC_OR_DEPARTMENT_OTHER): Payer: Self-pay | Admitting: Emergency Medicine

## 2014-10-18 ENCOUNTER — Emergency Department (HOSPITAL_BASED_OUTPATIENT_CLINIC_OR_DEPARTMENT_OTHER)
Admission: EM | Admit: 2014-10-18 | Discharge: 2014-10-18 | Disposition: A | Discharge status: Home / Self Care | Payer: Commercial Managed Care - PPO | Attending: Emergency Medicine | Admitting: Emergency Medicine

## 2014-10-18 ENCOUNTER — Emergency Department (HOSPITAL_BASED_OUTPATIENT_CLINIC_OR_DEPARTMENT_OTHER): Payer: Commercial Managed Care - PPO

## 2014-10-18 DIAGNOSIS — G43909 Migraine, unspecified, not intractable, without status migrainosus: Secondary | ICD-10-CM | POA: Insufficient documentation

## 2014-10-18 DIAGNOSIS — Z79899 Other long term (current) drug therapy: Secondary | ICD-10-CM | POA: Diagnosis not present

## 2014-10-18 DIAGNOSIS — Z8739 Personal history of other diseases of the musculoskeletal system and connective tissue: Secondary | ICD-10-CM | POA: Insufficient documentation

## 2014-10-18 DIAGNOSIS — K5909 Other constipation: Secondary | ICD-10-CM | POA: Diagnosis not present

## 2014-10-18 DIAGNOSIS — K5904 Chronic idiopathic constipation: Secondary | ICD-10-CM

## 2014-10-18 DIAGNOSIS — R112 Nausea with vomiting, unspecified: Secondary | ICD-10-CM | POA: Diagnosis present

## 2014-10-18 DIAGNOSIS — R110 Nausea: Secondary | ICD-10-CM

## 2014-10-18 LAB — I-STAT ARTERIAL BLOOD GAS, ED
Acid-base deficit: 6 mmol/L — ABNORMAL HIGH (ref 0.0–2.0)
Bicarbonate: 17.4 mEq/L — ABNORMAL LOW (ref 20.0–24.0)
O2 Saturation: 97 %
PCO2 ART: 28.2 mmHg — AB (ref 35.0–45.0)
Patient temperature: 98.6
TCO2: 18 mmol/L (ref 0–100)
pH, Arterial: 7.399 (ref 7.350–7.450)
pO2, Arterial: 89 mmHg (ref 80.0–100.0)

## 2014-10-18 LAB — I-STAT CHEM 8, ED
BUN: 13 mg/dL (ref 6–20)
CALCIUM ION: 1.24 mmol/L — AB (ref 1.12–1.23)
CHLORIDE: 109 mmol/L (ref 101–111)
CREATININE: 0.8 mg/dL (ref 0.44–1.00)
GLUCOSE: 126 mg/dL — AB (ref 65–99)
HEMATOCRIT: 43 % (ref 36.0–46.0)
Hemoglobin: 14.6 g/dL (ref 12.0–15.0)
Potassium: 3.1 mmol/L — ABNORMAL LOW (ref 3.5–5.1)
SODIUM: 141 mmol/L (ref 135–145)
TCO2: 17 mmol/L (ref 0–100)

## 2014-10-18 MED ORDER — PROMETHAZINE HCL 25 MG/ML IJ SOLN
25.0000 mg | Freq: Once | INTRAMUSCULAR | Status: AC
  Administered 2014-10-18: 25 mg via INTRAMUSCULAR
  Filled 2014-10-18: qty 1

## 2014-10-18 MED ORDER — ONDANSETRON 8 MG PO TBDP
8.0000 mg | ORAL_TABLET | Freq: Once | ORAL | Status: AC
Start: 2014-10-18 — End: 2014-10-18
  Administered 2014-10-18: 8 mg via ORAL
  Filled 2014-10-18: qty 1

## 2014-10-18 MED ORDER — DICYCLOMINE HCL 10 MG/ML IM SOLN
20.0000 mg | Freq: Once | INTRAMUSCULAR | Status: AC
  Administered 2014-10-18: 20 mg via INTRAMUSCULAR
  Filled 2014-10-18: qty 2

## 2014-10-18 MED ORDER — LORAZEPAM 2 MG/ML IJ SOLN
0.5000 mg | Freq: Once | INTRAMUSCULAR | Status: AC
  Administered 2014-10-18: 0.5 mg via INTRAMUSCULAR
  Filled 2014-10-18: qty 1

## 2014-10-18 MED ORDER — MORPHINE SULFATE 4 MG/ML IJ SOLN
4.0000 mg | Freq: Once | INTRAMUSCULAR | Status: AC
  Administered 2014-10-18: 4 mg via INTRAMUSCULAR
  Filled 2014-10-18: qty 1

## 2014-10-18 MED ORDER — PANTOPRAZOLE SODIUM 40 MG PO TBEC: 40.0000 mg | DELAYED_RELEASE_TABLET | Freq: Every day | ORAL | Status: AC

## 2014-10-18 MED ORDER — METOCLOPRAMIDE HCL 10 MG PO TABS: 10.0000 mg | ORAL_TABLET | Freq: Three times a day (TID) | ORAL | Status: AC

## 2014-10-18 MED ORDER — DIPHENHYDRAMINE HCL 50 MG/ML IJ SOLN
12.5000 mg | Freq: Once | INTRAMUSCULAR | Status: AC
  Administered 2014-10-18: 12.5 mg via INTRAMUSCULAR
  Filled 2014-10-18: qty 1

## 2014-10-18 NOTE — ED Notes (Signed)
Pt reports that she started vomiting again yesterday after eating solid food, states that nausea never improved

## 2014-10-18 NOTE — ED Notes (Signed)
Upon entry to room to discharge pt. Husband in room asking to speak to Dr. Nicanor Alcon. MD aware.

## 2014-10-18 NOTE — Discharge Instructions (Signed)

## 2014-10-18 NOTE — ED Provider Notes (Signed)
CSN: [id number]     Arrival date & time 10/18/14  0234 History   First MD Initiated Contact with Patient 10/18/14 0310     Chief Complaint  Patient presents with  . Emesis     (Consider location/radiation/quality/duration/timing/severity/associated sxs/prior Treatment) Patient is a 49 y.o. female presenting with vomiting. The history is provided by the patient.  Emesis Severity:  Moderate Timing:  Intermittent Quality:  Stomach contents Progression:  Unchanged Chronicity:  Recurrent Recent urination:  Normal Context: not post-tussive   Relieved by:  Nothing Worsened by:  Nothing tried Ineffective treatments:  None tried Associated symptoms: no diarrhea, no fever and no headaches   Risk factors: no alcohol use and no travel to endemic areas   Prescribed phenergan suppositories and reglan following her visit on 8/6 but ate solids this evening and symptoms started again.  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  No f/c/r.  No diarrhea no blood in emesis or stool.  No urinary symptoms.   She is currently using fentanyl patches and klonopin as a part of her treatment prescrubed by Dr. Antonietta Barcelona.    Past Medical History  Diagnosis Date  . DDD (degenerative disc disease)   . Migraine   . Connective tissue disease    Past Surgical History  Procedure Laterality Date  . Appendectomy    . Tonsillectomy    . Uterine sling    . Abdominal hysterectomy    . Bladder suspension    . Vaginalplasty    . Endocele repair    . Anterior and posterior vaginal repair     History reviewed. No pertinent family history. History  Substance Use Topics  . Smoking status: Never Smoker   . Smokeless tobacco: Not on file  . Alcohol Use: No   OB History    No data available     Review of Systems  Constitutional: Negative for fever.  Gastrointestinal: Positive for nausea and vomiting. Negative for diarrhea, constipation and blood in stool.  Genitourinary:  Negative for dysuria.  Neurological: Negative for headaches.  All other systems reviewed and are negative.     Allergies  Biaxin; Decadron; Depacon; Depakote; Dhea; Toradol; Ultram; and Reglan  Home Medications   Prior to Admission medications   Medication Sig Start Date End Date Taking? Authorizing Provider  BuPROPion HCl (WELLBUTRIN PO) Take by mouth.    Historical Provider, MD  ClonazePAM (KLONOPIN PO) Take by mouth.    Historical Provider, MD  Esomeprazole Magnesium (NEXIUM PO) Take by mouth.    Historical Provider, MD  fentaNYL (DURAGESIC - DOSED MCG/HR) 50 MCG/HR Place 50 mcg onto the skin every 3 (three) days.    Historical Provider, MD  HYDROcodone-acetaminophen (NORCO/VICODIN) 5-325 MG per tablet Take 1 tablet by mouth every 6 (six) hours as needed for moderate pain.    Historical Provider, MD  hydroxychloroquine (PLAQUENIL) 200 MG tablet Take by mouth daily.    Historical Provider, MD  metoCLOPramide (REGLAN) 10 MG tablet Take 1 tablet (10 mg total) by mouth every 6 (six) hours. 10/15/14   Nicole Pisciotta, PA-C  ondansetron (ZOFRAN ODT) 4 MG disintegrating tablet 4mg  ODT q4 hours prn nausea/vomit 07/23/14   Toy Cookey, MD  Ondansetron HCl (ZOFRAN PO) Take by mouth.    Historical Provider, MD  oxyCODONE-acetaminophen (PERCOCET/ROXICET) 5-325 MG per tablet Take 1 tablet by mouth every 4 (four) hours as needed. 08/16/13   Kristen N Ward, DO  phenazopyridine (PYRIDIUM) 95 MG tablet Take 1 tablet (  95 mg total) by mouth 3 (three) times daily as needed for pain. 08/16/13   Kristen N Ward, DO  Prochlorperazine Maleate (COMPAZINE PO) Take by mouth.    Historical Provider, MD  promethazine (PHENERGAN) 25 MG suppository Place 1 suppository (25 mg total) rectally every 6 (six) hours as needed for nausea or vomiting. 10/15/14   Nicole Pisciotta, PA-C  TIZANIDINE HCL PO Take by mouth.    Historical Provider, MD  topiramate (TOPAMAX) 100 MG tablet Take 100 mg by mouth 2 (two) times daily.     Historical Provider, MD  ZOLMitriptan (ZOMIG PO) Take by mouth.    Historical Provider, MD  Zolpidem Tartrate (AMBIEN PO) Take by mouth.    Historical Provider, MD  zonisamide (ZONEGRAN) 100 MG capsule Take 100 mg by mouth daily.    Historical Provider, MD   BP 108/97 mmHg  Pulse 86  Temp(Src) 98.3 F (36.8 C) (Oral)  Resp 18  Ht 5' 8.5" (1.74 m)  Wt 145 lb (65.772 kg)  BMI 21.72 kg/m2  SpO2 100% Physical Exam  Constitutional: She is oriented to person, place, and time. She appears well-developed and well-nourished. No distress.  HENT:  Head: Normocephalic and atraumatic.  Mouth/Throat: Oropharynx is clear and moist.  Eyes: Conjunctivae are normal. Pupils are equal, round, and reactive to light.  Neck: Normal range of motion. Neck supple.  Cardiovascular: Normal rate, regular rhythm and intact distal pulses.   Pulmonary/Chest: Effort normal and breath sounds normal. No respiratory distress. She has no wheezes. She has no rales.  Abdominal: Soft. She exhibits no mass. Bowel sounds are increased. There is no tenderness. There is no rigidity, no rebound, no guarding, no tenderness at McBurney's point and negative Murphy's sign.  Musculoskeletal: Normal range of motion.  Neurological: She is alert and oriented to person, place, and time.  Skin: Skin is warm and dry.  Turgor is normal   Psychiatric: She has a normal mood and affect.    ED Course  Procedures (including critical care time) Labs Review Labs Reviewed  URINALYSIS, ROUTINE W REFLEX MICROSCOPIC (NOT AT [facility])    Imaging Review No results found.   EKG Interpretation None      MDM   Final diagnoses:  None   Patient is allergic to many of the medications we would normally use for her symptoms.    IV x 4 unsuccessful by nurses.  Medication placed as IM.    Attempted EJ patient was unable to tolerate the placement, ultrasound guided and within the vessel.  Will arterial stick for labs.  Chem 8 normal gap, no  elevation of BUN or creatinine.    Bladder full on bedside US.  Awaiting urine specimen to evaluate.    Xray with moderate stool in the colon no signs of obstruction.    Pt is a 49 y.o. female with Pmhx as above who presents with abdominal cramping and nausea and vomiting.  Was just seen 10/15/14.  Labs were unremarkable from that visit.  No gap nor hypokalemia. She reports many similar episodes over the last 2-3 years.  She's had extensive work ups for nausea and vomiting without clear cause being found. She reports multiple abdominal imaging studies, EGD, and per her admission gastric emptying studies. She states she follows with GI associated with [facility] she has not seen them since her last visit. She denies fevers, chills, hematemesis, diarrhea, constipation or blood in stool, dysuria, hematuria or frequency. Episode is similar to prior. Physical exam and vital signs  are benign and reassuring. She will need to follow up with her GI specialist in the am for ongoing care.  While she states she feels nauseated she is resting comfortably and has not vomited in the ED.    Lebron Quam evaluation in the Emergency Department is complete. It has been determined that no acute conditions requiring further emergency intervention are present at this time. The patient have been advised of the diagnosis and plan. We have discussed signs and symptoms that warrant return to the ED.   Cy Blamer, MD 10/18/14 [phone]

## 2014-10-18 NOTE — ED Notes (Signed)
Pt states not ready to be d/c'd yet d/t sipping on gingerale and still nausea; states waiting on her husband to come back, EDP notified

## 2015-05-22 DIAGNOSIS — G541 Lumbosacral plexus disorders: Secondary | ICD-10-CM | POA: Insufficient documentation

## 2015-05-22 DIAGNOSIS — M47816 Spondylosis without myelopathy or radiculopathy, lumbar region: Secondary | ICD-10-CM | POA: Insufficient documentation

## 2015-08-02 DIAGNOSIS — G8929 Other chronic pain: Secondary | ICD-10-CM | POA: Insufficient documentation

## 2015-08-02 DIAGNOSIS — K5904 Chronic idiopathic constipation: Secondary | ICD-10-CM | POA: Insufficient documentation

## 2015-08-07 DIAGNOSIS — E538 Deficiency of other specified B group vitamins: Secondary | ICD-10-CM | POA: Insufficient documentation

## 2015-08-07 DIAGNOSIS — E782 Mixed hyperlipidemia: Secondary | ICD-10-CM | POA: Insufficient documentation

## 2015-08-07 DIAGNOSIS — E559 Vitamin D deficiency, unspecified: Secondary | ICD-10-CM | POA: Insufficient documentation

## 2015-08-29 DIAGNOSIS — R1115 Cyclical vomiting syndrome unrelated to migraine: Secondary | ICD-10-CM | POA: Insufficient documentation

## 2017-08-09 ENCOUNTER — Emergency Department (HOSPITAL_BASED_OUTPATIENT_CLINIC_OR_DEPARTMENT_OTHER): Payer: Managed Care, Other (non HMO)

## 2017-08-09 ENCOUNTER — Emergency Department (HOSPITAL_BASED_OUTPATIENT_CLINIC_OR_DEPARTMENT_OTHER)
Admission: EM | Admit: 2017-08-09 | Discharge: 2017-08-09 | Disposition: A | Discharge status: Home / Self Care | Payer: Managed Care, Other (non HMO) | Attending: Physician Assistant | Admitting: Physician Assistant

## 2017-08-09 ENCOUNTER — Other Ambulatory Visit: Payer: Self-pay

## 2017-08-09 ENCOUNTER — Encounter (HOSPITAL_BASED_OUTPATIENT_CLINIC_OR_DEPARTMENT_OTHER): Payer: Self-pay | Admitting: Emergency Medicine

## 2017-08-09 DIAGNOSIS — R0789 Other chest pain: Secondary | ICD-10-CM | POA: Diagnosis not present

## 2017-08-09 DIAGNOSIS — Z79899 Other long term (current) drug therapy: Secondary | ICD-10-CM | POA: Insufficient documentation

## 2017-08-09 DIAGNOSIS — R079 Chest pain, unspecified: Secondary | ICD-10-CM | POA: Diagnosis present

## 2017-08-09 LAB — BASIC METABOLIC PANEL
Anion gap: 9 (ref 5–15)
BUN: 13 mg/dL (ref 6–20)
CHLORIDE: 110 mmol/L (ref 101–111)
CO2: 22 mmol/L (ref 22–32)
CREATININE: 0.86 mg/dL (ref 0.44–1.00)
Calcium: 9 mg/dL (ref 8.9–10.3)
GFR calc non Af Amer: >60 mL/min (ref >60)
Glucose, Bld: 93 mg/dL (ref 65–99)
POTASSIUM: 3.8 mmol/L (ref 3.5–5.1)
SODIUM: 141 mmol/L (ref 135–145)

## 2017-08-09 LAB — TROPONIN I
Troponin I: <0.03 ng/mL (ref <0.03)
Troponin I: <0.03 ng/mL (ref <0.03)

## 2017-08-09 LAB — CBC
HEMATOCRIT: 38.5 % (ref 36.0–46.0)
HEMOGLOBIN: 12.3 g/dL (ref 12.0–15.0)
MCH: 30.1 pg (ref 26.0–34.0)
MCHC: 31.9 g/dL (ref 30.0–36.0)
MCV: 94.4 fL (ref 78.0–100.0)
Platelets: 167 10*3/uL (ref 150–400)
RBC: 4.08 MIL/uL (ref 3.87–5.11)
RDW: 12.7 % (ref 11.5–15.5)
WBC: 3.7 10*3/uL — ABNORMAL LOW (ref 4.0–10.5)

## 2017-08-09 MED ORDER — IBUPROFEN 400 MG PO TABS
600.0000 mg | ORAL_TABLET | Freq: Once | ORAL | Status: AC
  Administered 2017-08-09: 600 mg via ORAL
  Filled 2017-08-09: qty 1

## 2017-08-09 MED ORDER — MORPHINE SULFATE (PF) 2 MG/ML IV SOLN
2.0000 mg | Freq: Once | INTRAVENOUS | Status: AC
  Administered 2017-08-09: 2 mg via INTRAVENOUS
  Filled 2017-08-09: qty 1

## 2017-08-09 MED ORDER — MORPHINE SULFATE (PF) 4 MG/ML IV SOLN
4.0000 mg | Freq: Once | INTRAVENOUS | Status: AC
  Administered 2017-08-09: 4 mg via INTRAVENOUS
  Filled 2017-08-09: qty 1

## 2017-08-09 MED ORDER — IOPAMIDOL (ISOVUE-370) INJECTION 76%
100.0000 mL | Freq: Once | INTRAVENOUS | Status: AC | PRN
  Administered 2017-08-09: 100 mL via INTRAVENOUS

## 2017-08-09 MED ORDER — SODIUM CHLORIDE 0.9 % IV BOLUS
1000.0000 mL | Freq: Once | INTRAVENOUS | Status: AC
  Administered 2017-08-09: 1000 mL via INTRAVENOUS

## 2017-08-09 NOTE — ED Provider Notes (Signed)
[facility] EMERGENCY DEPARTMENT Provider Note   CSN: [id number] Arrival date & time: 08/09/17  1156     History   Chief Complaint Chief Complaint  Patient presents with  . Chest Pain    HPI April Rhodes is a 52 y.o. female with a past medical history of connective tissue disease, degenerative disc disease, who presents to ED for evaluation of 2-day history of left-sided chest pain.  States that the pain worsened last night and this morning.  The pain has been constant since his worsened this morning.  She also reports shortness of breath with walking short distances.  She denies any prior history of similar symptoms.  She has taken a dose of her home Vicodin with no improvement in her symptoms.  She denies any cough, hemoptysis, leg swelling, prior DVT, PE or MI, recent surgeries, palpitations, fever, abdominal pain, vomiting.  No family history of CAD at a young age.  Denies any tobacco or other drug use.  HPI  Past Medical History:  Diagnosis Date  . Connective tissue disease (HCC)   . DDD (degenerative disc disease)   . Migraine     There are no active problems to display for this patient.   Past Surgical History:  Procedure Laterality Date  . ABDOMINAL HYSTERECTOMY    . ANTERIOR AND POSTERIOR VAGINAL REPAIR    . APPENDECTOMY    . BLADDER SUSPENSION    . endocele repair    . TONSILLECTOMY    . uterine sling    . vaginalplasty       OB History   None      Home Medications    Prior to Admission medications   Medication Sig Start Date End Date Taking? Authorizing Provider  BuPROPion HCl (WELLBUTRIN PO) Take by mouth.    [provider]  ClonazePAM (KLONOPIN PO) Take by mouth.    [provider]  Esomeprazole Magnesium (NEXIUM PO) Take by mouth.    [provider]  fentaNYL (DURAGESIC - DOSED MCG/HR) 50 MCG/HR Place 50 mcg onto the skin every 3 (three) days.    [provider]  HYDROcodone-acetaminophen  (NORCO/VICODIN) 5-325 MG per tablet Take 1 tablet by mouth every 6 (six) hours as needed for moderate pain.    [provider]  hydroxychloroquine (PLAQUENIL) 200 MG tablet Take by mouth daily.    [provider]  metoCLOPramide (REGLAN) 10 MG tablet Take 1 tablet (10 mg total) by mouth every 6 (six) hours. 10/15/14   Pisciotta, Joni Reining, PA-C  metoCLOPramide (REGLAN) 10 MG tablet Take 1 tablet (10 mg total) by mouth 4 (four) times daily -  before meals and at bedtime. 10/18/14   Palumbo, April, MD  ondansetron (ZOFRAN ODT) 4 MG disintegrating tablet 4mg  ODT q4 hours prn nausea/vomit 07/23/14   Toy Cookey, MD  Ondansetron HCl (ZOFRAN PO) Take by mouth.    [provider]  oxyCODONE-acetaminophen (PERCOCET/ROXICET) 5-325 MG per tablet Take 1 tablet by mouth every 4 (four) hours as needed. 08/16/13   Ward, Layla Maw, DO  pantoprazole (PROTONIX) 40 MG tablet Take 1 tablet (40 mg total) by mouth daily. 10/18/14   Palumbo, April, MD  phenazopyridine (PYRIDIUM) 95 MG tablet Take 1 tablet (95 mg total) by mouth 3 (three) times daily as needed for pain. 08/16/13   Ward, Layla Maw, DO  Prochlorperazine Maleate (COMPAZINE PO) Take by mouth.    [provider]  promethazine (PHENERGAN) 25 MG suppository Place 1 suppository (25 mg total)  rectally every 6 (six) hours as needed for nausea or vomiting. 10/15/14   Pisciotta, Joni ReiningNicole, PA-C  TIZANIDINE HCL PO Take by mouth.    [provider]  topiramate (TOPAMAX) 100 MG tablet Take 100 mg by mouth 2 (two) times daily.    [provider]  ZOLMitriptan (ZOMIG PO) Take by mouth.    [provider]  Zolpidem Tartrate (AMBIEN PO) Take by mouth.    [provider]  zonisamide (ZONEGRAN) 100 MG capsule Take 100 mg by mouth daily.    [provider]    Family History No family history on file.  Social History Social History   Tobacco Use  . Smoking status: Never Smoker  . Smokeless tobacco: Never  Used  Substance Use Topics  . Alcohol use: No  . Drug use: No     Allergies   Biaxin [clarithromycin]; Decadron [dexamethasone]; Depacon [valproic acid]; Depakote [divalproex sodium]; Dhea [nutritional supplements]; Toradol [ketorolac tromethamine]; Ultram [tramadol]; and Reglan [metoclopramide]   Review of Systems Review of Systems  Constitutional: Negative for appetite change, chills and fever.  HENT: Negative for ear pain, rhinorrhea, sneezing and sore throat.   Eyes: Negative for photophobia and visual disturbance.  Respiratory: Positive for shortness of breath. Negative for cough, chest tightness and wheezing.   Cardiovascular: Positive for chest pain. Negative for palpitations.  Gastrointestinal: Negative for abdominal pain, blood in stool, constipation, diarrhea, nausea and vomiting.  Genitourinary: Negative for dysuria, hematuria and urgency.  Musculoskeletal: Negative for myalgias.  Skin: Negative for rash.  Neurological: Negative for dizziness, weakness and light-headedness.     Physical Exam Updated Vital Signs BP (!) 143/80   Pulse 64   Temp 98.5 F (36.9 C) (Oral)   Resp 20   Ht 5\' 7"  (1.702 m)   Wt 63.5 kg (140 lb)   SpO2 100%   BMI 21.93 kg/m   Physical Exam  Constitutional: She appears well-developed and well-nourished. No distress.  HENT:  Head: Normocephalic and atraumatic.  Nose: Nose normal.  Eyes: Conjunctivae and EOM are normal. Left eye exhibits no discharge. No scleral icterus.  Neck: Normal range of motion. Neck supple.  Cardiovascular: Normal rate, regular rhythm, normal heart sounds and intact distal pulses. Exam reveals no gallop and no friction rub.  No murmur heard. Pulmonary/Chest: Effort normal and breath sounds normal. No respiratory distress.  Abdominal: Soft. Bowel sounds are normal. She exhibits no distension. There is no tenderness. There is no guarding.  Musculoskeletal: Normal range of motion. She exhibits no edema.       Right  lower leg: She exhibits no tenderness and no edema.       Left lower leg: She exhibits no tenderness and no edema.  No lower extremity edema, erythema or calf tenderness bilaterally.  Neurological: She is alert. She exhibits normal muscle tone. Coordination normal.  Skin: Skin is warm and dry. No rash noted.  Psychiatric: She has a normal mood and affect.  Nursing note and vitals reviewed.    ED Treatments / Results  Labs (all labs ordered are listed, but only abnormal results are displayed) Labs Reviewed  CBC - Abnormal; Notable for the following components:      Result Value   WBC 3.7 (*)    All other components within normal limits  BASIC METABOLIC PANEL  TROPONIN I  TROPONIN I    EKG EKG Interpretation  Date/Time:  Saturday August 09 2017 12:03:17 EDT Ventricular Rate:  62 PR Interval:  122 QRS Duration:  78 QT Interval:  414 QTC Calculation: 420 R Axis:   79 Text Interpretation:  Normal sinus rhythm Normal ECG Normal sinus rhythm Confirmed by Bary Castilla ([id number]) on 08/09/2017 2:23:12 PM   Radiology Dg Chest 2 View  Result Date: 08/09/2017 CLINICAL DATA:  Sharp chest pain. EXAM: CHEST - 2 VIEW COMPARISON:  January 28, 2017 FINDINGS: Rounded density over the left apex, thought to be an EKG lead. Recommend clinical correlation. No other nodules or masses. The heart, hila, and mediastinum are normal. No focal infiltrates. IMPRESSION: Subtle rounded density over the left apex favored to represent an EKG lead. Recommend clinical correlation. No other abnormalities. Electronically Signed   By: Gerome Sam III M.D   On: 08/09/2017 12:21   Ct Angio Chest/abd/pel For Dissection W And/or W/wo  Result Date: 08/09/2017 CLINICAL DATA:  52 year old female with chest pain and a history of connective tissue disease. Evaluate for aortic dissection. EXAM: CT ANGIOGRAPHY CHEST, ABDOMEN AND PELVIS TECHNIQUE: Multidetector CT imaging through the chest, abdomen and pelvis was performed  using the standard protocol during bolus administration of intravenous contrast. Multiplanar reconstructed images and MIPs were obtained and reviewed to evaluate the vascular anatomy. CONTRAST:  ISOVUE-370 IOPAMIDOL (ISOVUE-370) INJECTION 76% COMPARISON:  Chest x-ray obtained earlier today; prior CT scan of the abdomen and pelvis 10/24/2015 FINDINGS: CTA CHEST FINDINGS Cardiovascular: Initial noncontrast CT imaging of the chest demonstrates no evidence of acute intramural hematoma. Following administration of intravenous contrast, there is excellent opacification of the arterial structures. The aorta is normal in size. No evidence of thoracic aortic dissection. The heart is normal in size. No pericardial effusion. Adequate opacification of the pulmonary arteries to the segmental level. No evidence of central pulmonary embolus. Mediastinum/Nodes: Unremarkable CT appearance of the thyroid gland. No suspicious mediastinal or hilar adenopathy. No soft tissue mediastinal mass. The thoracic esophagus is unremarkable. Lungs/Pleura: The lungs are clear save for a tiny 2 mm pulmonary nodule in the right lower lobe. This is almost certainly benign and requires no follow-up evaluation. Musculoskeletal: No acute fracture or aggressive appearing lytic or blastic osseous lesion. Review of the MIP images confirms the above findings. CTA ABDOMEN AND PELVIS FINDINGS VASCULAR Aorta: Normal caliber aorta without aneurysm, dissection, vasculitis or significant stenosis. Celiac: Patent without evidence of aneurysm, dissection, vasculitis or significant stenosis. SMA: Patent without evidence of aneurysm, dissection, vasculitis or significant stenosis. Renals: Both renal arteries are patent without evidence of aneurysm, dissection, vasculitis, fibromuscular dysplasia or significant stenosis. IMA: Patent without evidence of aneurysm, dissection, vasculitis or significant stenosis. Inflow: Patent without evidence of aneurysm,  dissection, vasculitis or significant stenosis. Veins: No obvious venous abnormality within the limitations of this arterial phase study. Review of the MIP images confirms the above findings. NON-VASCULAR Hepatobiliary: No focal liver abnormality is seen. Status post cholecystectomy. No biliary dilatation. Pancreas: Unremarkable. No pancreatic ductal dilatation or surrounding inflammatory changes. Spleen: Normal in size without focal abnormality. Adrenals/Urinary Tract: Adrenal glands are unremarkable. Kidneys are normal, without renal calculi, focal lesion, or hydronephrosis. Bladder is unremarkable. Stomach/Bowel: No focal bowel wall thickening or evidence of obstruction. Moderately large colonic stool burden. Lymphatic: No suspicious lymphadenopathy. Reproductive: Status post hysterectomy. No adnexal masses. Other: No abdominal wall hernia or abnormality. No abdominopelvic ascites. Musculoskeletal: No acute or significant osseous findings. Review of the MIP images confirms the above findings. IMPRESSION: CTA CHEST 1. No evidence of thoracic aortic dissection or other acute vascular abnormality, central PE, pneumonia or other acute cardiopulmonary process. CTA ABD/PELVIS 1. No evidence  of aortic dissection or other acute vascular abnormality. 2. Essentially unremarkable CTA of the abdomen and pelvis. Electronically Signed   By: Malachy Moan M.D.   On: 08/09/2017 14:33    Procedures Procedures (including critical care time)  Medications Ordered in ED Medications  morphine 4 MG/ML injection 4 mg (4 mg Intravenous Given 08/09/17 1237)  sodium chloride 0.9 % bolus 1,000 mL (0 mLs Intravenous Stopped 08/09/17 1423)  iopamidol (ISOVUE-370) 76 % injection 100 mL (100 mLs Intravenous Contrast Given 08/09/17 1350)  morphine 2 MG/ML injection 2 mg (2 mg Intravenous Given 08/09/17 1442)     Initial Impression / Assessment and Plan / ED Course  I have reviewed the triage vital signs and the nursing  notes.  Pertinent labs & imaging results that were available during my care of the patient were reviewed by me and considered in my medical decision making (see chart for details).     Patient presents to ED for evaluation of 2-day history of left-sided chest pain.  Pain worsened last night and this morning.  It has been constant since this morning.  She also reports associated shortness of breath and radiation of the pain to her left shoulder.  Denies any prior DVT, PE or MI, recent surgeries, palpitations, fever, abdominal pain, hemoptysis.  On physical exam she is overall well-appearing.  She does appear uncomfortable.  However, her vital signs are within normal limits.  She is not tachycardic, tachypneic or hypoxic.  She is afebrile.  Troponin, CBC, BMP unremarkable.  Chest x-ray unremarkable.  Patient does have a history of connective tissue disease so considered dissection as a cause of her pain based on the fact that she is uncomfortable.  CT dissection study returned as unremarkable.  Delta troponin is negative. Low risk by heart score (score=2). Wells criteria negative.  Patient reports some improvement in her symptoms with medication given here.  She has Vicodin at home.  Will refer to cardiology and advised her to follow-up with her primary care provider for further evaluation.  Advised to return to ED for any severe worsening symptoms.  Portions of this note were generated with Scientist, clinical (histocompatibility and immunogenetics). Dictation errors may occur despite best attempts at proofreading.   Final Clinical Impressions(s) / ED Diagnoses   Final diagnoses:  Chest wall pain    ED Discharge Orders    None       Dietrich Pates, PA-C 08/09/17 1611    Abelino Derrick, MD 08/10/17 [phone]

## 2017-08-09 NOTE — Discharge Instructions (Signed)
Your lab work, EKG, chest x-ray, CT studies were all unremarkable. Continue taking your pain medication as prescribed. Follow-up with your primary care provider and the cardiologist listed below for further evaluation. Return to ED for worsening symptoms, injuries or falls, shortness of breath, coughing up blood.

## 2017-08-09 NOTE — ED Triage Notes (Signed)
L side chest pain since yesterday, radiating to L shoulder.

## 2017-08-09 NOTE — ED Notes (Signed)
Patient transported to X-ray 

## 2017-12-16 DIAGNOSIS — F411 Generalized anxiety disorder: Secondary | ICD-10-CM | POA: Insufficient documentation

## 2018-10-01 DIAGNOSIS — N183 Chronic kidney disease, stage 3 unspecified: Secondary | ICD-10-CM | POA: Insufficient documentation

## 2018-10-29 DIAGNOSIS — N390 Urinary tract infection, site not specified: Secondary | ICD-10-CM | POA: Insufficient documentation

## 2020-09-18 DIAGNOSIS — I471 Supraventricular tachycardia: Secondary | ICD-10-CM | POA: Insufficient documentation

## 2020-10-04 ENCOUNTER — Encounter (HOSPITAL_BASED_OUTPATIENT_CLINIC_OR_DEPARTMENT_OTHER): Payer: Self-pay | Admitting: *Deleted

## 2020-10-04 ENCOUNTER — Emergency Department (HOSPITAL_BASED_OUTPATIENT_CLINIC_OR_DEPARTMENT_OTHER): Payer: Medicare (Managed Care)

## 2020-10-04 ENCOUNTER — Emergency Department (HOSPITAL_BASED_OUTPATIENT_CLINIC_OR_DEPARTMENT_OTHER)
Admission: EM | Admit: 2020-10-04 | Discharge: 2020-10-04 | Disposition: A | Discharge status: Home / Self Care | Payer: Medicare (Managed Care) | Attending: Emergency Medicine | Admitting: Emergency Medicine

## 2020-10-04 ENCOUNTER — Other Ambulatory Visit: Payer: Self-pay

## 2020-10-04 DIAGNOSIS — M7989 Other specified soft tissue disorders: Secondary | ICD-10-CM | POA: Insufficient documentation

## 2020-10-04 DIAGNOSIS — M79674 Pain in right toe(s): Secondary | ICD-10-CM | POA: Insufficient documentation

## 2020-10-04 DIAGNOSIS — M79671 Pain in right foot: Secondary | ICD-10-CM

## 2020-10-04 HISTORY — DX: Rheumatoid arthritis, unspecified: M06.9

## 2020-10-04 MED ORDER — OXYCODONE-ACETAMINOPHEN 5-325 MG PO TABS
1.0000 | ORAL_TABLET | Freq: Once | ORAL | Status: AC
  Administered 2020-10-04: 1 via ORAL
  Filled 2020-10-04: qty 1

## 2020-10-04 MED ORDER — HYDROMORPHONE HCL 1 MG/ML IJ SOLN
1.0000 mg | Freq: Once | INTRAMUSCULAR | Status: AC
  Administered 2020-10-04: 1 mg via INTRAMUSCULAR
  Filled 2020-10-04: qty 1

## 2020-10-04 NOTE — ED Triage Notes (Addendum)
C/o right foot pain/swelling x 1 week , denies injury

## 2020-10-04 NOTE — ED Notes (Signed)
Pt continues to c/o pain after percocet given.  Provider made aware

## 2020-10-04 NOTE — ED Provider Notes (Signed)
[facility] EMERGENCY DEPARTMENT Provider Note   CSN: [id number] Arrival date & time: 10/04/20  1721     History Chief Complaint  Patient presents with   Foot Pain    April Rhodes is a 55 y.o. female with a history of connective tissue disease, degenerative disc disease, and rheumatoid arthritis.  Patient presents emerged department with a chief complaint of pain and swelling to right foot.  Symptoms started last Friday 7/22.  Symptoms have gotten progressively worse then.  Pain is located aspect of right foot proximal to right great toe.  Patient rates pain 10/10 on pain scale.  Pain is worse with movement or touch.  Patient has had minimal relief of symptoms with prednisone or Vicodin.  Patient denies any injury.   Denies any fevers, chills, numbness, weakness, rash, erythema, confusion, nausea, vomiting.  No history of DVT or PE, surgery in the last 4 weeks, cancer treatment, hormone therapy, recent immobilization.   Foot Pain Pertinent negatives include no chest pain, no abdominal pain and no shortness of breath.      Past Medical History:  Diagnosis Date   Connective tissue disease (HCC)    DDD (degenerative disc disease)    Migraine    RA (rheumatoid arthritis) (HCC)     There are no problems to display for this patient.   Past Surgical History:  Procedure Laterality Date   ABDOMINAL HYSTERECTOMY     ANTERIOR AND POSTERIOR VAGINAL REPAIR     APPENDECTOMY     BLADDER SUSPENSION     endocele repair     TONSILLECTOMY     uterine sling     vaginalplasty       OB History   No obstetric history on file.     No family history on file.  Social History   Tobacco Use   Smoking status: Never   Smokeless tobacco: Never  Substance Use Topics   Alcohol use: No   Drug use: No    Home Medications Prior to Admission medications   Medication Sig Start Date End Date Taking? Authorizing Provider  BuPROPion HCl (WELLBUTRIN PO) Take by mouth.    [provider]  ClonazePAM (KLONOPIN PO) Take by mouth.    [provider]  Esomeprazole Magnesium (NEXIUM PO) Take by mouth.    [provider]  fentaNYL (DURAGESIC - DOSED MCG/HR) 50 MCG/HR Place 50 mcg onto the skin every 3 (three) days.    [provider]  HYDROcodone-acetaminophen (NORCO/VICODIN) 5-325 MG per tablet Take 1 tablet by mouth every 6 (six) hours as needed for moderate pain.    [provider]  hydroxychloroquine (PLAQUENIL) 200 MG tablet Take by mouth daily.    [provider]  metoCLOPramide (REGLAN) 10 MG tablet Take 1 tablet (10 mg total) by mouth every 6 (six) hours. 10/15/14   Pisciotta, Joni Reining, PA-C  metoCLOPramide (REGLAN) 10 MG tablet Take 1 tablet (10 mg total) by mouth 4 (four) times daily -  before meals and at bedtime. 10/18/14   Palumbo, April, MD  ondansetron (ZOFRAN ODT) 4 MG disintegrating tablet 4mg  ODT q4 hours prn nausea/vomit 07/23/14   07/25/14, MD  Ondansetron HCl (ZOFRAN PO) Take by mouth.    [provider]  oxyCODONE-acetaminophen (PERCOCET/ROXICET) 5-325 MG per tablet Take 1 tablet by mouth every 4 (four) hours as needed. 08/16/13   Ward, 10/16/13, DO  pantoprazole (PROTONIX) 40 MG tablet Take 1 tablet (40 mg total) by mouth daily. 10/18/14  Palumbo, April, MD  phenazopyridine (PYRIDIUM) 95 MG tablet Take 1 tablet (95 mg total) by mouth 3 (three) times daily as needed for pain. 08/16/13   Ward, Layla Maw, DO  potassium chloride SA (K-DUR,KLOR-CON) 20 MEQ tablet Take 20 mEq by mouth once.    [provider]  Prochlorperazine Maleate (COMPAZINE PO) Take by mouth.    [provider]  promethazine (PHENERGAN) 25 MG suppository Place 1 suppository (25 mg total) rectally every 6 (six) hours as needed for nausea or vomiting. 10/15/14   Pisciotta, Joni Reining, PA-C  sulfamethoxazole-trimethoprim (BACTRIM,SEPTRA) 400-80 MG tablet Take 1 tablet by mouth daily.    [provider]  TIZANIDINE HCL  PO Take by mouth.    [provider]  topiramate (TOPAMAX) 100 MG tablet Take 100 mg by mouth 2 (two) times daily.    [provider]  ZOLMitriptan (ZOMIG PO) Take by mouth.    [provider]  Zolpidem Tartrate (AMBIEN PO) Take by mouth.    [provider]  zonisamide (ZONEGRAN) 100 MG capsule Take 100 mg by mouth daily.    [provider]    Allergies    Biaxin [clarithromycin], Decadron [dexamethasone], Depacon [valproic acid], Depakote [divalproex sodium], Dhea [nutritional supplements], Toradol [ketorolac tromethamine], Ultram [tramadol], and Reglan [metoclopramide]  Review of Systems   Review of Systems  Constitutional:  Negative for chills and fever.  Respiratory:  Negative for shortness of breath.   Cardiovascular:  Negative for chest pain.  Gastrointestinal:  Negative for abdominal pain, nausea and vomiting.  Musculoskeletal:  Positive for arthralgias and myalgias. Negative for back pain and neck pain.  Skin:  Negative for color change, pallor, rash and wound.  Allergic/Immunologic: Positive for immunocompromised state.  Neurological:  Negative for weakness and numbness.  Psychiatric/Behavioral:  Negative for confusion.    Physical Exam Updated Vital Signs BP 126/67 (BP Location: Right Arm)   Pulse (!) 57   Temp 98.6 F (37 C) (Oral)   Resp 18   Ht 5' 7.5" (1.715 m)   Wt 72.1 kg   SpO2 97%   BMI 24.54 kg/m   Physical Exam Vitals and nursing note reviewed.  Constitutional:      General: She is not in acute distress.    Appearance: She is not ill-appearing, toxic-appearing or diaphoretic.  HENT:     Head: Normocephalic.  Eyes:     General: No scleral icterus.       Right eye: No discharge.        Left eye: No discharge.  Cardiovascular:     Rate and Rhythm: Normal rate.     Pulses:          Dorsalis pedis pulses are 2+ on the right side and 2+ on the left side.  Pulmonary:     Effort: Pulmonary effort is normal.   Musculoskeletal:     Right lower leg: Normal.     Left lower leg: Normal.     Right ankle: No swelling, deformity, ecchymosis or lacerations. No tenderness. Normal range of motion.     Left ankle: No swelling, deformity, ecchymosis or lacerations. No tenderness. Normal range of motion.     Right foot: Normal range of motion and normal capillary refill. Swelling, tenderness and bony tenderness present. No deformity, laceration or crepitus. Normal pulse.     Left foot: Normal range of motion and normal capillary refill. No swelling, deformity, laceration, tenderness, bony tenderness or crepitus. Normal pulse.     Comments: No erythema,  warmth, or rash to right foot.  Tenderness to palpation to medial aspect of right foot proximal to right great toe.  No swelling, tenderness or deformity to all digits of right foot.  Generalized swelling throughout dorsum of right foot.  Feet:     Right foot:     Skin integrity: Skin integrity normal.     Toenail Condition: Right toenails are normal.     Left foot:     Skin integrity: Skin integrity normal.     Toenail Condition: Left toenails are normal.  Skin:    General: Skin is warm and dry.  Neurological:     General: No focal deficit present.     Mental Status: She is alert.     GCS: GCS eye subscore is 4. GCS verbal subscore is 5. GCS motor subscore is 6.  Psychiatric:        Behavior: Behavior is cooperative.    ED Results / Procedures / Treatments   Labs (all labs ordered are listed, but only abnormal results are displayed) Labs Reviewed - No data to display  EKG None  Radiology US Venous Img Lower Unilateral Right  Result Date: 10/04/2020 CLINICAL DATA:  Right lower extremity pain EXAM: RIGHT LOWER EXTREMITY VENOUS DOPPLER ULTRASOUND TECHNIQUE: Gray-scale sonography with compression, as well as color and duplex ultrasound, were performed to evaluate the deep venous system(s) from the level of the common femoral vein through the popliteal  and proximal calf veins. COMPARISON:  None. FINDINGS: VENOUS Normal compressibility of the common femoral, superficial femoral, and popliteal veins, as well as the visualized calf veins. Visualized portions of profunda femoral vein and great saphenous vein unremarkable. No filling defects to suggest DVT on grayscale or color Doppler imaging. Doppler waveforms show normal direction of venous flow, normal respiratory plasticity and response to augmentation. Limited views of the contralateral common femoral vein are unremarkable. OTHER None. Limitations: none IMPRESSION: No findings of right lower extremity DVT. Electronically Signed   By: Gaylyn RongWalter  Liebkemann M.D.   On: 10/04/2020 21:22   DG Foot Complete Right  Result Date: 10/04/2020 CLINICAL DATA:  Right foot pain and swelling for 1 week. No known injury. EXAM: RIGHT FOOT COMPLETE - 3+ VIEW COMPARISON:  None. FINDINGS: Advanced degenerative change at the first metatarsal phalangeal joint with joint space narrowing, peripheral osteophytes, and subchondral cystic change and sclerosis. There is slight flattening of the second metatarsal head without subchondral collapse. No fracture. Mild degenerative change of the midfoot. Moderate plantar calcaneal spur. No erosion or bone destruction. Mild soft tissue edema overlies the dorsum of the foot. No soft tissue air or radiopaque foreign body. IMPRESSION: 1. Advanced degenerative change at the first metatarsophalangeal joint. Mild degenerative change of the midfoot. 2. 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 No subchondral collapse. 3. Mild soft tissue edema overlies the dorsum of the foot. Electronically Signed   By: Narda RutherfordMelanie  Sanford M.D.   On: 10/04/2020 20:11    Procedures Procedures   Medications Ordered in ED Medications  oxyCODONE-acetaminophen (PERCOCET/ROXICET) 5-325 MG per tablet 1 tablet (1 tablet Oral Given 10/04/20 2027)    ED Course  I have reviewed the  triage vital signs and the nursing notes.  Pertinent labs & imaging results that were available during my care of the patient were reviewed by me and considered in my medical decision making (see chart for details).    MDM Rules/Calculators/A&P  Alert 55 year old female in no acute distress, toxic appearing.  Presents to the emergency department with a chief complaint of swelling and tenderness to right foot.  No erythema, warmth, or rash to right foot.  Tenderness to palpation to medial aspect of right foot proximal to right great toe.  No swelling, tenderness or deformity to all digits of right foot.  Generalized swelling throughout dorsum of right foot.  Low suspicion for septic arthritis, or cellulitis as patient has been erythema, warmth, or rash to right foot. Low suspicion for gout as patient has no swelling, warmth, no or erythema to right great toe.  Will obtain x-ray imaging to evaluate for fracture or dislocation.  Will obtain ultrasound imaging to evaluate for DVT due to patient's connective tissue disorder.  Patient given Percocet for pain management.  Ultrasound imaging shows no signs of DVT.  X-ray imaging shows advanced degenerative change at first metatarsophalangeal joint. Mild degenerative change of the midfoot.  Mild soft tissue edema overlies the dorsum of the foot.  Unclear if x-ray imaging findings is secondary to patient's history of rheumatoid arthritis.  Patient reports that she has follow-up with her rheumatologist tomorrow.  Will defer starting patient on any steroid therapy until consultation with rheumatologist.  Patient given information to follow-up with orthopedic provider.  Patient has prescribed Vicodin which she takes twice daily.  Discussed results, findings, treatment and follow up. Patient advised of return precautions. Patient verbalized understanding and agreed with plan.   Final Clinical Impression(s) / ED Diagnoses Final  diagnoses:  Foot pain, right    Rx / DC Orders ED Discharge Orders     None        Berneice Heinrich 10/05/20 0404    Vanetta Mulders, MD 10/12/20 [phone]

## 2020-10-04 NOTE — Discharge Instructions (Addendum)
You came to the emergency department today to be evaluated for your foot pain.  Your x-ray imaging showed findings consistent with degenerative disease.  This may be due to your connective tissue disorder.  Please follow-up with your rheumatologist tomorrow.  Your rheumatologist does not think this is due to your connective tissue disorder please use the information on this patient to contact the orthopedic provider.  Today you received medications that may make you sleepy or impair your ability to make decisions.  For the next 24 hours please do not drive, operate heavy machinery, care for a small child with out another adult present, or perform any activities that may cause harm to you or someone else if you were to fall asleep or be impaired.   Get help right away if: Your foot is numb or tingling. Your foot or toes are swollen. Your foot or toes turn white or blue. You have warmth and redness along your foot.

## 2021-06-03 ENCOUNTER — Other Ambulatory Visit: Payer: Self-pay

## 2021-06-03 ENCOUNTER — Emergency Department (HOSPITAL_BASED_OUTPATIENT_CLINIC_OR_DEPARTMENT_OTHER)
Admission: EM | Admit: 2021-06-03 | Discharge: 2021-06-03 | Disposition: A | Discharge status: Home / Self Care | Payer: Medicare (Managed Care) | Attending: Emergency Medicine | Admitting: Emergency Medicine

## 2021-06-03 ENCOUNTER — Emergency Department (HOSPITAL_BASED_OUTPATIENT_CLINIC_OR_DEPARTMENT_OTHER): Payer: Medicare (Managed Care)

## 2021-06-03 ENCOUNTER — Encounter (HOSPITAL_BASED_OUTPATIENT_CLINIC_OR_DEPARTMENT_OTHER): Payer: Self-pay

## 2021-06-03 DIAGNOSIS — N189 Chronic kidney disease, unspecified: Secondary | ICD-10-CM | POA: Insufficient documentation

## 2021-06-03 DIAGNOSIS — R0602 Shortness of breath: Secondary | ICD-10-CM | POA: Diagnosis present

## 2021-06-03 DIAGNOSIS — S2242XA Multiple fractures of ribs, left side, initial encounter for closed fracture: Secondary | ICD-10-CM | POA: Insufficient documentation

## 2021-06-03 DIAGNOSIS — X509XXA Other and unspecified overexertion or strenuous movements or postures, initial encounter: Secondary | ICD-10-CM | POA: Insufficient documentation

## 2021-06-03 DIAGNOSIS — G8929 Other chronic pain: Secondary | ICD-10-CM | POA: Insufficient documentation

## 2021-06-03 DIAGNOSIS — S29001A Unspecified injury of muscle and tendon of front wall of thorax, initial encounter: Secondary | ICD-10-CM | POA: Diagnosis present

## 2021-06-03 MED ORDER — HYDROMORPHONE HCL 1 MG/ML IJ SOLN
1.0000 mg | Freq: Once | INTRAMUSCULAR | Status: AC
  Administered 2021-06-03: 1 mg via INTRAMUSCULAR
  Filled 2021-06-03: qty 1

## 2021-06-03 MED ORDER — LIDOCAINE 5 % EX PTCH
1.0000 | MEDICATED_PATCH | CUTANEOUS | Status: DC
  Administered 2021-06-03: 1 via TRANSDERMAL
  Filled 2021-06-03: qty 1

## 2021-06-03 MED ORDER — LIDOCAINE 5 % EX PTCH: 1.0000 | MEDICATED_PATCH | CUTANEOUS | 0 refills | Status: AC

## 2021-06-03 NOTE — ED Notes (Signed)
Patient instructed on the IS. Patient demonstrated good effort.  ?

## 2021-06-03 NOTE — Discharge Instructions (Addendum)
Use the incentive spirometer as instructed.  Use the lidocaine patches in addition to your hydrocodone pain medication.  Follow-up with your primary doctor or you could consider seeing a thoracic surgeon to discuss treatment options.  Most of the time, rib fractures will heal without additional treatment. ?

## 2021-06-03 NOTE — ED Provider Notes (Signed)
?[facility] EMERGENCY DEPARTMENT ?Provider Note ? ? ?CSN: [id number] ?Arrival date & time: 06/03/21  1235 ? ?  ? ?History ? ?Chief Complaint  ?Patient presents with  ? Shortness of Breath  ? ? ?April Rhodes is a 56 y.o. female. ? ? ?Shortness of Breath ? ?Patient presented to the ED for evaluation of left-sided rib pain and shortness of breath.  Patient has a history of migraines, mixed connective tissue disease, hyperlipidemia, chronic kidney disease, SVT and chronic pain syndrome.  Patient is on chronic monthly hydrocodone.  Patient states she had a cold about 4 weeks ago.  She felt a pop after coughing really hard.  Patient states about 3 days ago after turning over in her bed she started to feel more sharp intense pain in the left rib.  Pain increases with certain movements.  She also feels like a popping or something clicking in her rib area.  Patient initially was not having too much shortness of breath. ? ?Home Medications ?Prior to Admission medications   ?Medication Sig Start Date End Date Taking? Authorizing Provider  ?lidocaine (LIDODERM) 5 % Place 1 patch onto the skin daily. Remove & Discard patch within 12 hours or as directed by MD 06/03/21  Yes Dorie Rank, MD  ?BuPROPion HCl (WELLBUTRIN PO) Take by mouth.    [provider]  ?ClonazePAM (KLONOPIN PO) Take by mouth.    [provider]  ?Esomeprazole Magnesium (NEXIUM PO) Take by mouth.    [provider]  ?fentaNYL (DURAGESIC - DOSED MCG/HR) 50 MCG/HR Place 50 mcg onto the skin every 3 (three) days.    [provider]  ?HYDROcodone-acetaminophen (NORCO/VICODIN) 5-325 MG per tablet Take 1 tablet by mouth every 6 (six) hours as needed for moderate pain.    [provider]  ?hydroxychloroquine (PLAQUENIL) 200 MG tablet Take by mouth daily.    [provider]  ?metoCLOPramide (REGLAN) 10 MG tablet Take 1 tablet (10 mg total) by mouth every 6 (six) hours. 10/15/14   Pisciotta, Elmyra Ricks, PA-C   ?metoCLOPramide (REGLAN) 10 MG tablet Take 1 tablet (10 mg total) by mouth 4 (four) times daily -  before meals and at bedtime. 10/18/14   Palumbo, April, MD  ?ondansetron (ZOFRAN ODT) 4 MG disintegrating tablet 4mg  ODT q4 hours prn nausea/vomit 07/23/14   Ernestina Patches, MD  ?Ondansetron HCl (ZOFRAN PO) Take by mouth.    [provider]  ?oxyCODONE-acetaminophen (PERCOCET/ROXICET) 5-325 MG per tablet Take 1 tablet by mouth every 4 (four) hours as needed. 08/16/13   Ward, Delice Bison, DO  ?pantoprazole (PROTONIX) 40 MG tablet Take 1 tablet (40 mg total) by mouth daily. 10/18/14   Palumbo, April, MD  ?phenazopyridine (PYRIDIUM) 95 MG tablet Take 1 tablet (95 mg total) by mouth 3 (three) times daily as needed for pain. 08/16/13   Ward, Delice Bison, DO  ?potassium chloride SA (K-DUR,KLOR-CON) 20 MEQ tablet Take 20 mEq by mouth once.    [provider]  ?Prochlorperazine Maleate (COMPAZINE PO) Take by mouth.    [provider]  ?promethazine (PHENERGAN) 25 MG suppository Place 1 suppository (25 mg total) rectally every 6 (six) hours as needed for nausea or vomiting. 10/15/14   Pisciotta, Elmyra Ricks, PA-C  ?sulfamethoxazole-trimethoprim (BACTRIM,SEPTRA) 400-80 MG tablet Take 1 tablet by mouth daily.    [provider]  ?TIZANIDINE HCL PO Take by mouth.    [provider]  ?topiramate (TOPAMAX) 100 MG tablet Take 100 mg by mouth 2 (two) times daily.  [provider]  ?ZOLMitriptan (ZOMIG PO) Take by mouth.    [provider]  ?Zolpidem Tartrate (AMBIEN PO) Take by mouth.    [provider]  ?zonisamide (ZONEGRAN) 100 MG capsule Take 100 mg by mouth daily.    [provider]  ?   ? ?Allergies    ?Biaxin [clarithromycin], Decadron [dexamethasone], Depacon [valproic acid], Depakote [divalproex sodium], Dhea [nutritional supplements], Toradol [ketorolac tromethamine], Ultram [tramadol], and Reglan [metoclopramide]   ? ?Review of Systems   ?Review of Systems   ?Respiratory:  Positive for shortness of breath.   ? ?Physical Exam ?Updated Vital Signs ?BP 129/72 (BP Location: Right Arm)   Pulse (!) 53   Temp 98.1 ?F (36.7 ?C) (Oral)   Resp 18   Ht 1.715 m (5' 7.5")   SpO2 97%   BMI 24.54 kg/m?  ?Physical Exam ? ?ED Results / Procedures / Treatments   ?Labs ?(all labs ordered are listed, but only abnormal results are displayed) ?Labs Reviewed - No data to display ? ?EKG ?None ? ?Radiology ?DG Ribs Unilateral W/Chest Left ? ?Result Date: 06/03/2021 ?CLINICAL DATA:  Shortness of breath, fractured ribs. EXAM: LEFT RIBS AND CHEST - 3+ VIEW COMPARISON:  Chest radiograph dated 05/31/2021. FINDINGS: A fracture of the left fourth rib is redemonstrated, however it is less well seen on today's exam compared to prior exam. No additional rib fractures are identified. There is no evidence of pneumothorax or pleural effusion. Both lungs are clear. Heart size and mediastinal contours are within normal limits. IMPRESSION: Redemonstrated left fourth rib fracture. No acute pulmonary process. Electronically Signed   By: Zerita Boers M.D.   On: 06/03/2021 13:20   ? ?Procedures ?Procedures  ? ? ?Medications Ordered in ED ?Medications  ?lidocaine (LIDODERM) 5 % 1 patch (1 patch Transdermal Patch Applied 06/03/21 1409)  ?HYDROmorphone (DILAUDID) injection 1 mg (1 mg Intramuscular Given 06/03/21 1407)  ? ? ?ED Course/ Medical Decision Making/ A&P ?  ?                        ?Medical Decision Making ?Amount and/or Complexity of Data Reviewed ?External Data Reviewed: radiology and notes. ?Radiology: ordered and independent interpretation performed. ?   Details: No pneumothorax noted.  No large pleural effusion ? ?Risk ?Prescription drug management. ? ? ?Patient presented to the ED for evaluation of persistent rib pain and new shortness of breath after recent diagnosis of rib fractures.  Recent outpatient records reviewed from her visit a [facility] urgent care.  Patient had an abdominal pelvic CT  performed on March 23.  Did show incidental rib fractures on the left side.  Patient presented with complaints of shortness of breath this morning along with pain in her left ribs that was radiating to the back.  On exam patient has palpable tenderness in the left ribs.  This correlates with her symptoms.  X-rays show no pneumothorax.  Patient is breathing easily.  No tachycardia no tachypnea.  Patient given a dose of pain medications.  We will also try adding lidocaine patches to help with her pain.  Patient is several weeks out from her injury.  No indication for admission at this time.  Patient does have hydrocodone that she has for pain management at home ? ? ? ? ? ? ? ?Final Clinical Impression(s) / ED Diagnoses ?Final diagnoses:  ?Closed fracture of multiple ribs of left side, initial encounter  ? ? ?Rx / DC Orders ?ED Discharge  Orders   ? ?      Ordered  ?  lidocaine (LIDODERM) 5 %  Every 24 hours       ? 06/03/21 1424  ? ?  ?  ? ?  ? ? ?  ?Dorie Rank, MD ?06/04/21 [id number] ? ?

## 2021-06-03 NOTE — ED Triage Notes (Signed)
Seen at [facility] a few days ago, has 3 fractured ribs, one is displaced. Hx of connective tissue disease. States got out of bathtub today and became short of breath and started having pain in back.  ?

## 2023-09-24 IMAGING — DX DG RIBS W/ CHEST 3+V*L*
3 series · 3 of 3 positions shown · non-contrast
Comparison: Chest radiograph dated 05/31/2021.

CLINICAL DATA: Shortness of breath, fractured ribs.

EXAM:
LEFT RIBS AND CHEST - 3+ VIEW

[chest pa]
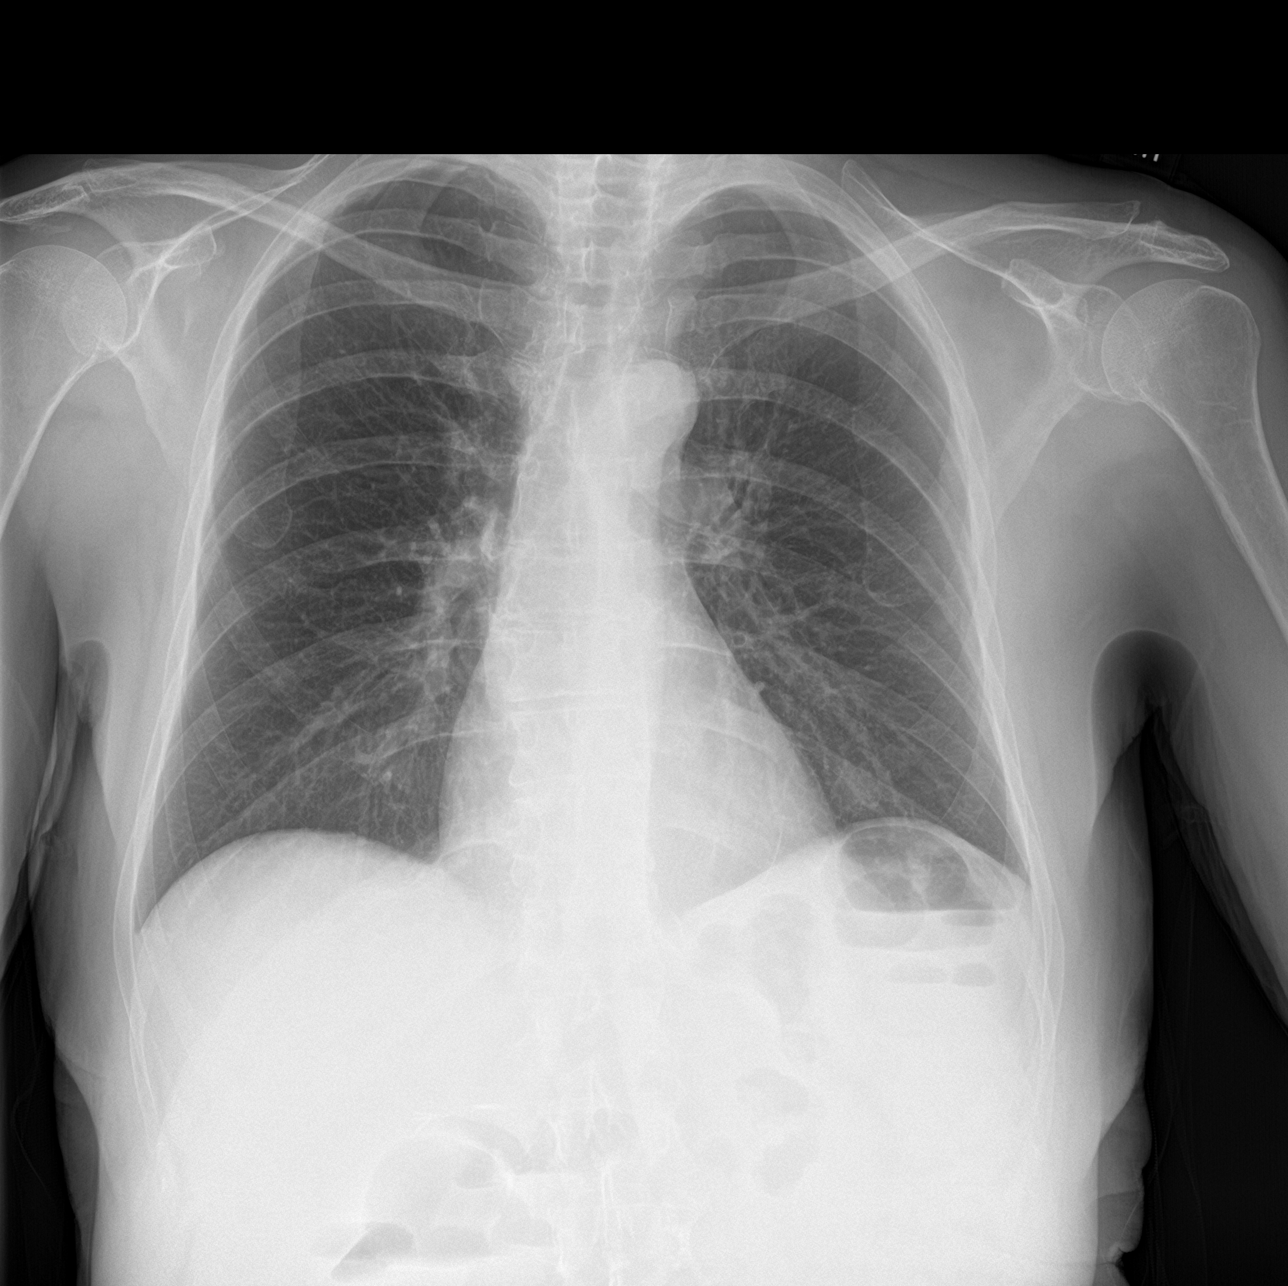

[rib pa]
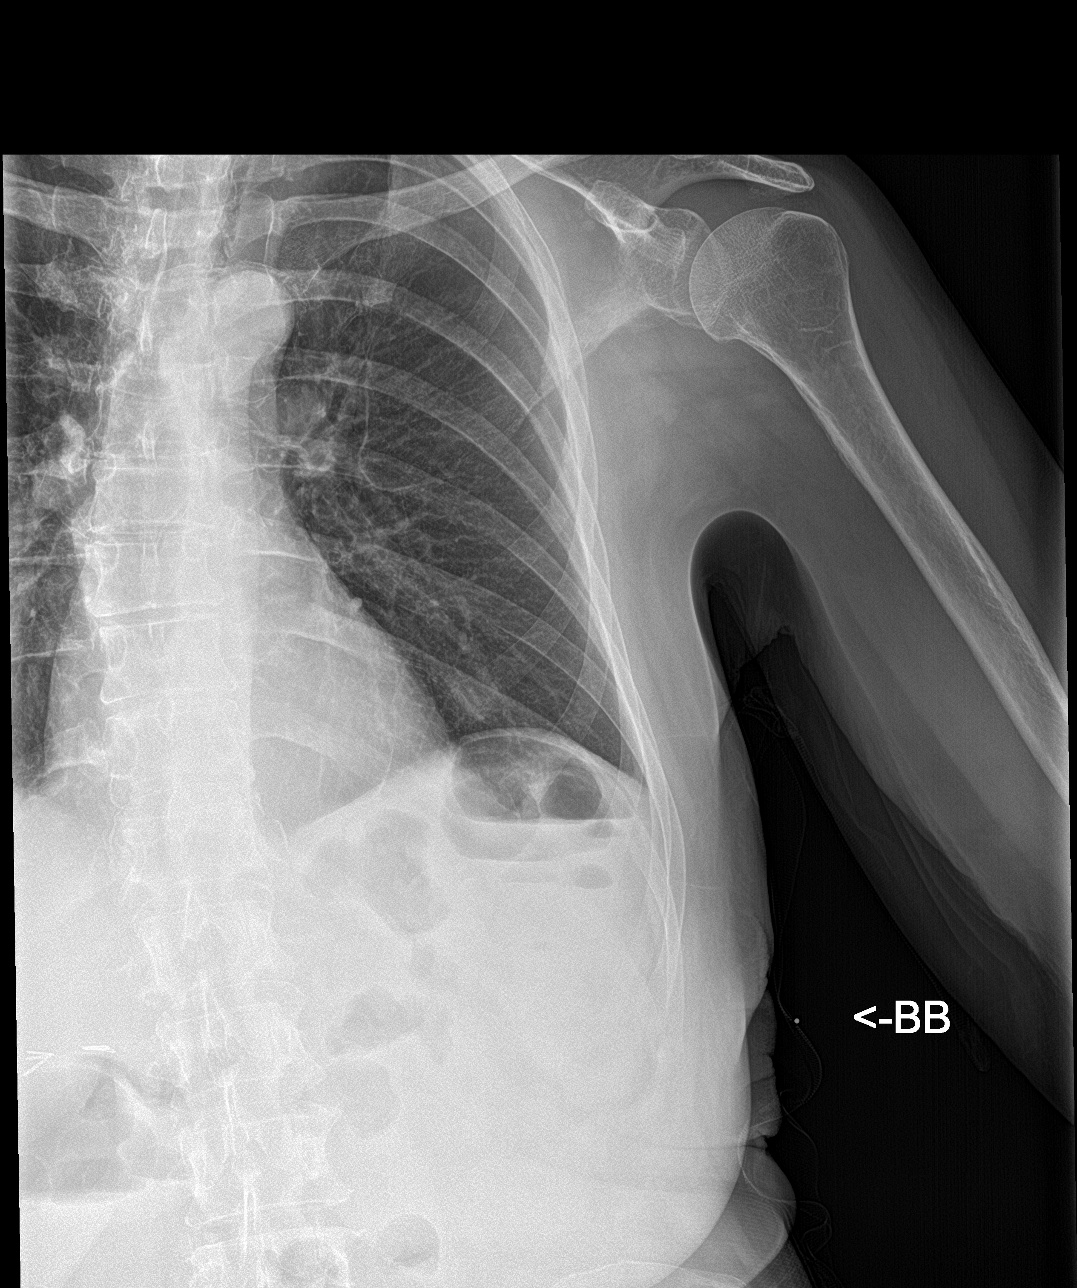

[rib ap obl]
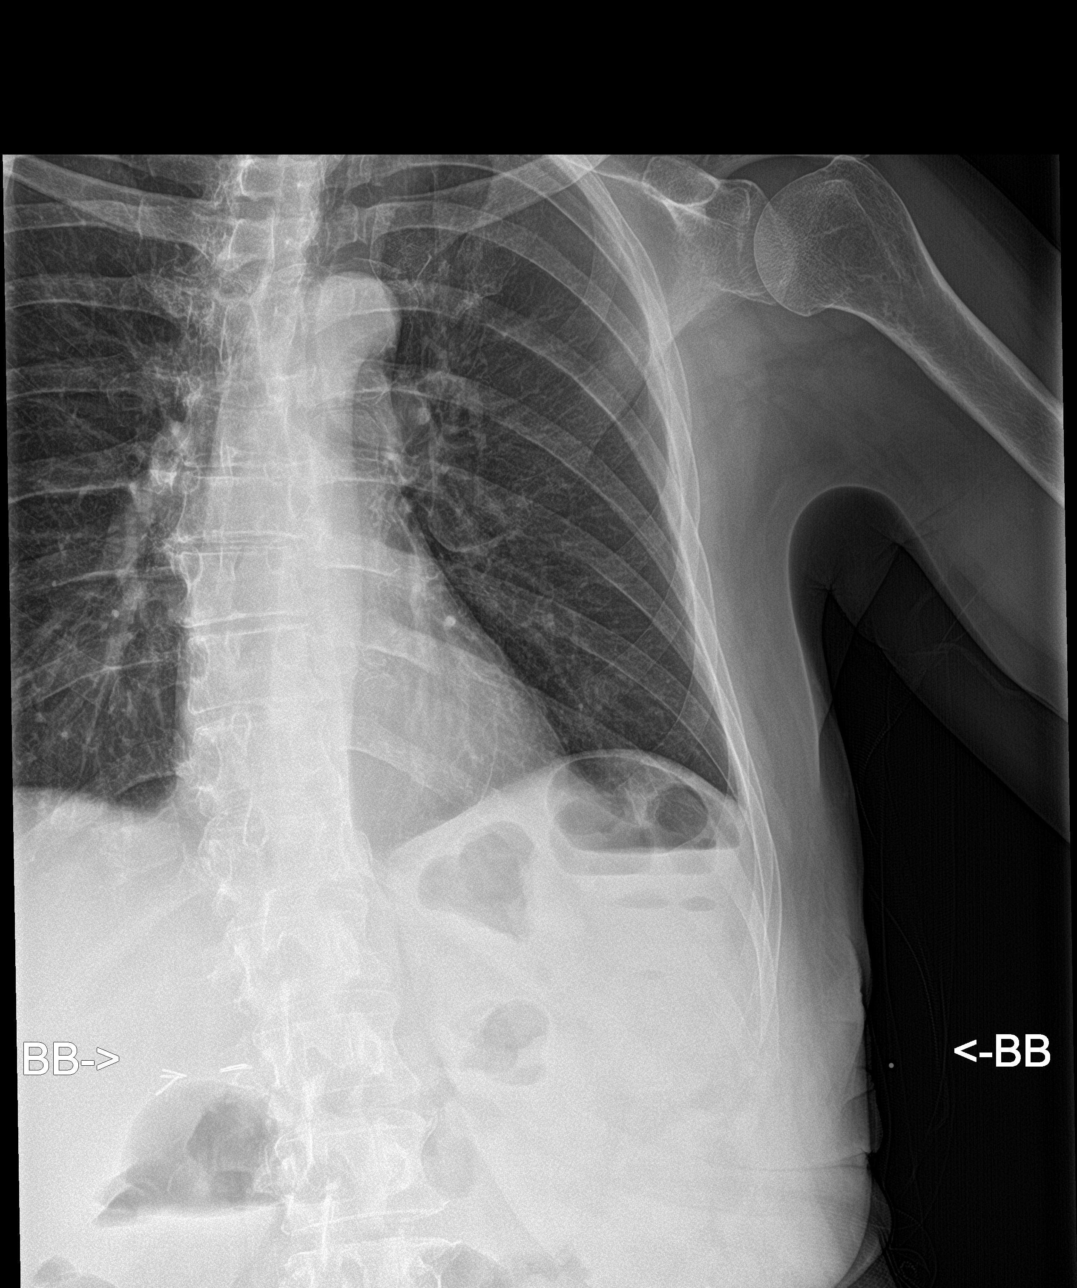

[3 of 3 positions shown; findings below may reference images not displayed]

FINDINGS: A fracture of the left fourth rib is redemonstrated, however it is
less well seen on today's exam compared to prior exam. No additional
rib fractures are identified. There is no evidence of pneumothorax
or pleural effusion. Both lungs are clear. Heart size and
mediastinal contours are within normal limits.
IMPRESSION: Redemonstrated left fourth rib fracture. No acute pulmonary process.
